# Patient Record
Sex: Male | Born: 1985 | Race: Black or African American | Hispanic: No | Marital: Single | Smoking: Never smoker
Health system: Southern US, Community
[De-identification: ages and names within clinical notes are randomized; demographics above are authoritative.]

## PROBLEM LIST (undated history)

## (undated) DIAGNOSIS — K219 Gastro-esophageal reflux disease without esophagitis: Secondary | ICD-10-CM

## (undated) DIAGNOSIS — I10 Essential (primary) hypertension: Secondary | ICD-10-CM

## (undated) HISTORY — DX: Gastro-esophageal reflux disease without esophagitis: K21.9

## (undated) HISTORY — DX: Essential (primary) hypertension: I10

---
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
## 2008-01-31 ENCOUNTER — Emergency Department (HOSPITAL_COMMUNITY): Admission: EM | Admit: 2008-01-31 | Discharge: 2008-01-31 | Payer: Self-pay | Admitting: Emergency Medicine

## 2008-03-13 ENCOUNTER — Emergency Department (HOSPITAL_COMMUNITY): Admission: EM | Admit: 2008-03-13 | Discharge: 2008-03-13 | Payer: Self-pay | Admitting: Family Medicine

## 2008-04-19 ENCOUNTER — Emergency Department (HOSPITAL_COMMUNITY): Admission: EM | Admit: 2008-04-19 | Discharge: 2008-04-19 | Payer: Self-pay | Admitting: Emergency Medicine

## 2008-04-21 ENCOUNTER — Emergency Department (HOSPITAL_COMMUNITY): Admission: EM | Admit: 2008-04-21 | Discharge: 2008-04-21 | Payer: Self-pay | Admitting: Family Medicine

## 2008-04-27 ENCOUNTER — Emergency Department (HOSPITAL_COMMUNITY): Admission: EM | Admit: 2008-04-27 | Discharge: 2008-04-28 | Payer: Self-pay | Admitting: Emergency Medicine

## 2009-05-07 ENCOUNTER — Emergency Department (HOSPITAL_COMMUNITY): Admission: EM | Admit: 2009-05-07 | Discharge: 2009-05-07 | Payer: Self-pay | Admitting: Family Medicine

## 2009-09-09 ENCOUNTER — Emergency Department (HOSPITAL_COMMUNITY): Admission: EM | Admit: 2009-09-09 | Discharge: 2009-09-09 | Payer: Self-pay | Admitting: Emergency Medicine

## 2009-10-25 ENCOUNTER — Emergency Department (HOSPITAL_COMMUNITY): Admission: EM | Admit: 2009-10-25 | Discharge: 2009-10-25 | Payer: Self-pay | Admitting: Family Medicine

## 2009-10-30 ENCOUNTER — Emergency Department (HOSPITAL_COMMUNITY): Admission: EM | Admit: 2009-10-30 | Discharge: 2009-10-30 | Payer: Self-pay | Admitting: Emergency Medicine

## 2011-03-30 LAB — POCT URINALYSIS DIP (DEVICE)
Glucose, UA: NEGATIVE
Hgb urine dipstick: NEGATIVE
Nitrite: NEGATIVE
Urobilinogen, UA: 1
pH: 5.5

## 2011-03-30 LAB — GC/CHLAMYDIA PROBE AMP, GENITAL
Chlamydia, DNA Probe: NEGATIVE
GC Probe Amp, Genital: NEGATIVE

## 2011-03-30 LAB — CULTURE, ROUTINE-ABSCESS: Gram Stain: NONE SEEN

## 2011-03-31 LAB — POCT I-STAT, CHEM 8
BUN: 11
Chloride: 101
HCT: 44
Potassium: 3.3 — ABNORMAL LOW
Sodium: 137

## 2011-03-31 LAB — RAPID STREP SCREEN (MED CTR MEBANE ONLY): Streptococcus, Group A Screen (Direct): NEGATIVE

## 2011-10-06 ENCOUNTER — Emergency Department (HOSPITAL_COMMUNITY)
Admission: EM | Admit: 2011-10-06 | Discharge: 2011-10-07 | Disposition: A | Discharge status: Home / Self Care | Payer: Self-pay | Attending: Emergency Medicine | Admitting: Emergency Medicine

## 2011-10-06 ENCOUNTER — Emergency Department (HOSPITAL_COMMUNITY): Payer: Self-pay

## 2011-10-06 ENCOUNTER — Encounter (HOSPITAL_COMMUNITY): Payer: Self-pay

## 2011-10-06 DIAGNOSIS — Y9229 Other specified public building as the place of occurrence of the external cause: Secondary | ICD-10-CM | POA: Insufficient documentation

## 2011-10-06 DIAGNOSIS — W19XXXA Unspecified fall, initial encounter: Secondary | ICD-10-CM | POA: Insufficient documentation

## 2011-10-06 DIAGNOSIS — M25579 Pain in unspecified ankle and joints of unspecified foot: Secondary | ICD-10-CM | POA: Insufficient documentation

## 2011-10-06 DIAGNOSIS — S93409A Sprain of unspecified ligament of unspecified ankle, initial encounter: Secondary | ICD-10-CM | POA: Insufficient documentation

## 2011-10-06 NOTE — ED Provider Notes (Signed)
History     CSN: [id number]  Arrival date & time 10/06/11  2144   First MD Initiated Contact with Patient 10/06/11 2318      Chief Complaint  Patient presents with  . Ankle Pain    (Consider location/radiation/quality/duration/timing/severity/associated sxs/prior treatment) Patient is a 26 y.o. male presenting with ankle pain. The history is provided by the patient. No language interpreter was used.  Ankle Pain  The incident occurred 6 to 12 hours ago. The incident occurred at the gym. The injury mechanism was a fall. The pain is present in the right ankle. The quality of the pain is described as aching. The pain is moderate. The pain has been fluctuating since onset. Associated symptoms include inability to bear weight. Pertinent negatives include no loss of motion, no muscle weakness, no loss of sensation and no tingling. He reports no foreign bodies present. The symptoms are aggravated by bearing weight. He has tried rest for the symptoms. The treatment provided no relief.    History reviewed. No pertinent past medical history.  History reviewed. No pertinent past surgical history.  History reviewed. No pertinent family history.  History  Substance Use Topics  . Smoking status: Not on file  . Smokeless tobacco: Not on file  . Alcohol Use: No      Review of Systems  Musculoskeletal: Positive for arthralgias.  Neurological: Negative for tingling.  All other systems reviewed and are negative.    Allergies  Review of patient's allergies indicates no known allergies.  Home Medications  No current outpatient prescriptions on file.  BP 111/77  Pulse 84  Temp(Src) 97.8 F (36.6 C) (Oral)  Resp 20  SpO2 100%  Physical Exam  Nursing note and vitals reviewed. Constitutional: He is oriented to person, place, and time. He appears well-developed and well-nourished. No distress.  HENT:  Head: Normocephalic and atraumatic.  Eyes: Conjunctivae are normal. Pupils are  equal, round, and reactive to light.  Neck: Normal range of motion. Neck supple.  Cardiovascular: Normal rate, regular rhythm, normal heart sounds and intact distal pulses.   Pulmonary/Chest: Effort normal and breath sounds normal.  Abdominal: Soft.  Musculoskeletal: Normal range of motion. He exhibits tenderness.  Neurological: He is alert and oriented to person, place, and time.  Skin: Skin is warm and dry.  Psychiatric: He has a normal mood and affect. His behavior is normal. Judgment and thought content normal.    ED Course  Procedures (including critical care time)  Labs Reviewed - No data to display No results found.   No diagnosis found.  Right Ankle sprain.  MDM          Jimmye Norman, NP 10/07/11 [phone]

## 2011-10-06 NOTE — ED Notes (Signed)
Pt was playing basketball this evening and twisted his right ankle, he said he heard a pop

## 2011-10-07 MED ORDER — HYDROCODONE-ACETAMINOPHEN 5-500 MG PO TABS: 1.0000 | ORAL_TABLET | Freq: Four times a day (QID) | ORAL | Status: AC | PRN

## 2011-10-07 NOTE — Discharge Instructions (Signed)
Crutch Use You have been prescribed crutches to take weight off one of your lower legs or feet (extremities). When using crutches, make sure you are not putting pressure on the armpit (axilla). This could cause damage to the nerves that extend from your axilla to the hand and arm. When fitted properly the crutches should be 2 to 3 finger widths below the axilla. Your weight should be supported by your hand, and not by resting upon the crutch with the axilla. When walking, first step with the crutches, then swing the healthy leg through and slightly ahead. When going up stairs, first step up with the healthy leg and then follow with the crutches and injured leg up to the same step, and so forth. If there is a handrail, hold both crutches in one hand, place your other hand on the handrail, and while placing your weight on your arms, lift your good leg to the step, then bring the crutches and the injured leg up to that step. Repeat for each step. When going down stairs, first step with the injured leg and crutches, following down with the healthy leg to the same step. Be very careful, as going down stairs with crutches is very challenging. If you feel wobbly or nervous, sit down and inch yourself down the stairs on your butt. To get up from a chair, hold injured leg forward, grab armrest with one hand and the top of the crutches with the other hand. Using these supports, pull yourself up to a standing position. Reverse this procedure for sitting. See your caregiver for follow up as suggested. If you are discharged in an ace wrap and develop numbness, tingling, swelling, or increased pain, loosen the ace wrap and re-wrap looser. If these problems persist, see your caregiver as needed. If you have been instructed to use partial weight bearing, bear (apply) the amount of weight as suggested by your caregiver. Do not bear weight in an amount that causes pain on the area of injury. Document Released: 06/12/2000  Document Revised: 06/04/2011 Document Reviewed: 08/20/2008 [facility] Patient Information 2012 [place], [place].  Ankle Sprain An ankle sprain is an injury to the strong, fibrous tissues (ligaments) that hold the bones of your ankle joint together.  CAUSES Ankle sprain usually is caused by a fall or by twisting your ankle. People who participate in sports are more prone to these types of injuries.  SYMPTOMS  Symptoms of ankle sprain include:  Pain in your ankle. The pain may be present at rest or only when you are trying to stand or walk.   Swelling.   Bruising. Bruising may develop immediately or within 1 to 2 days after your injury.   Difficulty standing or walking.  DIAGNOSIS  Your caregiver will ask you details about your injury and perform a physical exam of your ankle to determine if you have an ankle sprain. During the physical exam, your caregiver will press and squeeze specific areas of your foot and ankle. Your caregiver will try to move your ankle in certain ways. An X-ray exam may be done to be sure a bone was not broken or a ligament did not separate from one of the bones in your ankle (avulsion).  TREATMENT  Certain types of braces can help stabilize your ankle. Your caregiver can make a recommendation for this. Your caregiver may recommend the use of medication for pain. If your sprain is severe, your caregiver may refer you to a surgeon who helps to restore function to  parts of your skeletal system (orthopedist) or a physical therapist. HOME CARE INSTRUCTIONS  Apply ice to your injury for 1 to 2 days or as directed by your caregiver. Applying ice helps to reduce inflammation and pain.  Put ice in a plastic bag.   Place a towel between your skin and the bag.   Leave the ice on for 15 to 20 minutes at a time, every 2 hours while you are awake.   Take over-the-counter or prescription medicines for pain, discomfort, or fever only as directed by your caregiver.   Keep your  injured leg elevated, when possible, to lessen swelling.   If your caregiver recommends crutches, use them as instructed. Gradually, put weight on the affected ankle. Continue to use crutches or a cane until you can walk without feeling pain in your ankle.   If you have a plaster splint, wear the splint as directed by your caregiver. Do not rest it on anything harder than a pillow the first 24 hours. Do not put weight on it. Do not get it wet. You may take it off to take a shower or bath.   You may have been given an elastic bandage to wear around your ankle to provide support. If the elastic bandage is too tight (you have numbness or tingling in your foot or your foot becomes cold and blue), adjust the bandage to make it comfortable.   If you have an air splint, you may blow more air into it or let air out to make it more comfortable. You may take your splint off at night and before taking a shower or bath.   Wiggle your toes in the splint several times per day if you are able.  SEEK MEDICAL CARE IF:   You have an increase in bruising, swelling, or pain.   Your toes feel cold.   Pain relief is not achieved with medication.  SEEK IMMEDIATE MEDICAL CARE IF: Your toes are numb or blue or you have severe pain. MAKE SURE YOU:   Understand these instructions.   Will watch your condition.   Will get help right away if you are not doing well or get worse.  Document Released: 06/15/2005 Document Revised: 06/04/2011 Document Reviewed: 01/18/2008 [facility] Patient Information 2012 [place], [place].

## 2011-10-08 NOTE — ED Provider Notes (Signed)
Medical screening examination/treatment/procedure(s) were performed by non-physician practitioner and as supervising physician I was immediately available for consultation/collaboration.   Vida Roller, MD 10/08/11 [phone]

## 2012-06-16 ENCOUNTER — Encounter (HOSPITAL_COMMUNITY): Payer: Self-pay

## 2012-06-16 ENCOUNTER — Emergency Department (INDEPENDENT_AMBULATORY_CARE_PROVIDER_SITE_OTHER)
Admission: EM | Admit: 2012-06-16 | Discharge: 2012-06-16 | Disposition: A | Discharge status: Home / Self Care | Payer: Self-pay | Source: Home / Self Care

## 2012-06-16 DIAGNOSIS — L739 Follicular disorder, unspecified: Secondary | ICD-10-CM

## 2012-06-16 DIAGNOSIS — R030 Elevated blood-pressure reading, without diagnosis of hypertension: Secondary | ICD-10-CM

## 2012-06-16 DIAGNOSIS — L738 Other specified follicular disorders: Secondary | ICD-10-CM

## 2012-06-16 DIAGNOSIS — L678 Other hair color and hair shaft abnormalities: Secondary | ICD-10-CM

## 2012-06-16 DIAGNOSIS — E669 Obesity, unspecified: Secondary | ICD-10-CM

## 2012-06-16 LAB — COMPREHENSIVE METABOLIC PANEL
ALT: 16 U/L (ref 0–53)
AST: 19 U/L (ref 0–37)
Alkaline Phosphatase: 65 U/L (ref 39–117)
CO2: 25 mEq/L (ref 19–32)
Chloride: 105 mEq/L (ref 96–112)
GFR calc Af Amer: >90 mL/min (ref >90)
GFR calc non Af Amer: >90 mL/min (ref >90)
Glucose, Bld: 115 mg/dL — ABNORMAL HIGH (ref 70–99)
Potassium: 3.7 mEq/L (ref 3.5–5.1)
Sodium: 142 mEq/L (ref 135–145)

## 2012-06-16 MED ORDER — SULFAMETHOXAZOLE-TMP DS 800-160 MG PO TABS: 1.0000 | ORAL_TABLET | Freq: Two times a day (BID) | ORAL | Status: DC

## 2012-06-16 MED ORDER — CHLORHEXIDINE GLUCONATE 4 % EX LIQD: 60.0000 mL | Freq: Every day | CUTANEOUS | Status: DC | PRN

## 2012-06-16 NOTE — ED Notes (Signed)
Patient states broke out with a rash on his chest and stomach area about 5 days ago.  OTC medications are not helping

## 2012-06-16 NOTE — ED Provider Notes (Signed)
History     CSN: [id number]  Arrival date & time 06/16/12  1251   None     Chief Complaint  Patient presents with  . Rash    (Consider location/radiation/quality/duration/timing/severity/associated sxs/prior treatment) The history is provided by the patient.    Pt says that he has been having a rash on the abdomen after a ruptured boil was noted on the abdomen.  He is having multiple areas of inflamed bumps on the abdomen.     History reviewed. No pertinent past medical history.  History reviewed. No pertinent past surgical history.  No family history on file.  History  Substance Use Topics  . Smoking status: Not on file  . Smokeless tobacco: Not on file  . Alcohol Use: No      Review of Systems  Constitutional: Negative.   HENT: Negative.   Eyes: Negative.   Respiratory: Negative.   Cardiovascular: Negative.   Musculoskeletal: Negative.   Neurological: Negative.   Psychiatric/Behavioral: Negative.     Allergies  Review of patient's allergies indicates no known allergies.  Home Medications  No current outpatient prescriptions on file.  BP 141/74  Pulse 77  Temp 98.1 F (36.7 C) (Oral)  Resp 18  SpO2 99%  Physical Exam  Constitutional: He is oriented to person, place, and time. He appears well-developed and well-nourished. No distress.  HENT:  Head: Normocephalic and atraumatic.  Eyes: EOM are normal. Pupils are equal, round, and reactive to light.  Pulmonary/Chest: Effort normal.  Abdominal: Soft. Bowel sounds are normal.  Musculoskeletal: Normal range of motion.  Neurological: He is alert and oriented to person, place, and time.  Skin: Skin is warm and dry.       Several areas of folliculitis noted on abdomen, swelling, redness, purulent areas around several hair follicles   Psychiatric: He has a normal mood and affect. His behavior is normal. Judgment and thought content normal.    ED Course  Procedures (including critical care  time)  Labs Reviewed - No data to display No results found. No diagnosis found.   MDM  IMPRESSION  Folliculitis  Elevated BP  Obesity  RECOMMENDATIONS / PLAN  Check some labs today Wash body down with Hibiclens Solution Soap Bactrim DS po BID x 1 week Check CMP and a1c today  FOLLOW UP 2 months for CPE and to recheck blood pressure  The patient was given clear instructions to go to ER or return to medical center if symptoms don't improve, worsen or new problems develop.  The patient verbalized understanding.  The patient was told to call to get lab results if they haven't heard anything in the next week.           Cleora Fleet, MD 06/16/12 1401

## 2012-06-20 ENCOUNTER — Telehealth (HOSPITAL_COMMUNITY): Payer: Self-pay

## 2012-06-20 NOTE — Telephone Encounter (Signed)
Message copied by Lestine Mount on Mon Jun 20, 2012  5:18 PM ------      Message from: Cleora Fleet      Created: Fri Jun 17, 2012  9:34 PM       Please notify patient that labs consistent with prediabetes.  Recommend low carb diet and exercise 3-5 times per week.  No concentrated sweets.  Recheck in 4 months.             Rodney Langton, MD, CDE, FAAFP      [facility]      [facility]      [place], [place]

## 2012-09-29 ENCOUNTER — Emergency Department (INDEPENDENT_AMBULATORY_CARE_PROVIDER_SITE_OTHER)
Admission: EM | Admit: 2012-09-29 | Discharge: 2012-09-29 | Disposition: A | Discharge status: Home / Self Care | Payer: Self-pay | Source: Home / Self Care | Attending: Emergency Medicine | Admitting: Emergency Medicine

## 2012-09-29 ENCOUNTER — Encounter (HOSPITAL_COMMUNITY): Payer: Self-pay | Admitting: Emergency Medicine

## 2012-09-29 DIAGNOSIS — M549 Dorsalgia, unspecified: Secondary | ICD-10-CM

## 2012-09-29 DIAGNOSIS — L0293 Carbuncle, unspecified: Secondary | ICD-10-CM

## 2012-09-29 DIAGNOSIS — L0292 Furuncle, unspecified: Secondary | ICD-10-CM

## 2012-09-29 MED ORDER — MELOXICAM 7.5 MG PO TABS: 7.5000 mg | ORAL_TABLET | Freq: Every day | ORAL | Status: DC

## 2012-09-29 MED ORDER — DOXYCYCLINE HYCLATE 100 MG PO CAPS: 100.0000 mg | ORAL_CAPSULE | Freq: Two times a day (BID) | ORAL | Status: AC

## 2012-09-29 NOTE — ED Provider Notes (Signed)
History     CSN: [id number]  Arrival date & time 09/29/12  1014   First MD Initiated Contact with Patient 09/29/12 1021      Chief Complaint  Patient presents with  . Back Pain    (Consider location/radiation/quality/duration/timing/severity/associated sxs/prior treatment) HPI Comments: Patient presents urgent care this morning complaining of lower back pain started about 3 days ago he denies any falls or injuries but does however describe that he was moving furniture about 4 days ago. Pain is located in his lower back sharp in character it is exacerbated with movements and changes in position. Patient denies any associated lower term any weakness or numbness or tingling sensation to his lower extremities. Patient also denies any urinary or bowel movement incontinence. Patient denies any constitutional symptoms such as fevers, unintentional weight loss or generalized malaise.  Patient also brings a second concern as he is been on and off for several months experiencing recurrent skin infections in the form of little tender bumps that are red and sometimes come to a whitehead.  Patient is a 27 y.o. male presenting with back pain. The history is provided by the patient.  Back Pain Location:  Lumbar spine Quality:  Aching Radiates to:  Does not radiate Pain severity:  Moderate Pain is:  Same all the time Onset quality:  Gradual Progression:  Waxing and waning Chronicity:  Recurrent Context: recent injury   Context: not occupational injury and not twisting   Relieved by:  Nothing Worsened by:  Movement Ineffective treatments:  None tried Associated symptoms: no fever     History reviewed. No pertinent past medical history.  History reviewed. No pertinent past surgical history.  No family history on file.  History  Substance Use Topics  . Smoking status: Not on file  . Smokeless tobacco: Not on file  . Alcohol Use: Yes      Review of Systems  Constitutional: Negative  for fever, chills, diaphoresis, activity change, appetite change and fatigue.  Musculoskeletal: Positive for back pain. Negative for myalgias, joint swelling and gait problem.  Skin: Positive for rash. Negative for color change, pallor and wound.    Allergies  Review of patient's allergies indicates no known allergies.  Home Medications   Current Outpatient Rx  Name  Route  Sig  Dispense  Refill  . chlorhexidine (HIBICLENS) 4 % external liquid   Topical   Apply 60 mLs (4 application total) topically daily as needed.   120 mL   0   . doxycycline (VIBRAMYCIN) 100 MG capsule   Oral   Take 1 capsule (100 mg total) by mouth 2 (two) times daily.   20 capsule   0   . meloxicam (MOBIC) 7.5 MG tablet   Oral   Take 1 tablet (7.5 mg total) by mouth daily. Take one tablet daily for 2 weeks   14 tablet   0   . sulfamethoxazole-trimethoprim (BACTRIM DS) 800-160 MG per tablet   Oral   Take 1 tablet by mouth 2 (two) times daily.   14 tablet   0     BP 147/89  Pulse 73  Temp(Src) 98.3 F (36.8 C) (Oral)  Resp 18  SpO2 96%  Physical Exam  Nursing note and vitals reviewed. Constitutional: Vital signs are normal. He appears well-developed and well-nourished.  Eyes: Conjunctivae are normal.  Musculoskeletal:       Lumbar back: He exhibits tenderness. He exhibits normal range of motion, no bony tenderness, no swelling, no edema, no deformity,  no laceration, no spasm and normal pulse.       Back:  Neurological: He is alert.  Skin: Rash noted. Rash is pustular. There is erythema. No pallor.       ED Course  Procedures (including critical care time)  Labs Reviewed  CULTURE, ROUTINE-ABSCESS   No results found.   1. Back pain   2. 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 Patient has been encouraged to take meloxicam to 2 weeks and we discuss long-term strategies as weight reduction. Have instructed  patient to return if any red flag symptoms verbal and written educational information was provided to patient.  Problem #2 recurrent for carbuncles and furunculosis- most likely secondary to his body habitus. Patient has been provided with a antibiotic cycle with doxycycline 1 of this pustular lesions was cultured today.        Jimmie Molly, MD 09/29/12 1300

## 2012-09-29 NOTE — ED Notes (Signed)
Multiple complains: back pain for 3 days, no specific injury remembered , but he did move furniture several days ago.  Pain is located in lower back.   Patient has "bumps" all over body.  First noticed a month ago.  Patient does have various stages of bumps breaking out on him, all over-some red with white top, some scabbed over, some scars.

## 2012-10-03 ENCOUNTER — Telehealth (HOSPITAL_COMMUNITY): Payer: Self-pay | Admitting: *Deleted

## 2012-10-03 LAB — CULTURE, ROUTINE-ABSCESS

## 2012-10-03 NOTE — ED Notes (Signed)
Abscess culture L finger: Few MRSA. Pt. adequately treated with Doxycycline.  I called pt.  Pt. verified x 2 and given results.  Pt. told he was adequately treated and given the [facility] MRSA instructions. Pt. voiced understanding. Pt.'s questions answered. Vassie Moselle 10/03/2012

## 2015-11-02 ENCOUNTER — Encounter (HOSPITAL_COMMUNITY): Payer: Self-pay | Admitting: Emergency Medicine

## 2015-11-02 ENCOUNTER — Emergency Department (HOSPITAL_COMMUNITY)
Admission: EM | Admit: 2015-11-02 | Discharge: 2015-11-02 | Disposition: A | Discharge status: Home / Self Care | Payer: Self-pay | Attending: Emergency Medicine | Admitting: Emergency Medicine

## 2015-11-02 DIAGNOSIS — K047 Periapical abscess without sinus: Secondary | ICD-10-CM

## 2015-11-02 DIAGNOSIS — K0889 Other specified disorders of teeth and supporting structures: Secondary | ICD-10-CM

## 2015-11-02 DIAGNOSIS — I10 Essential (primary) hypertension: Secondary | ICD-10-CM

## 2015-11-02 DIAGNOSIS — Z79899 Other long term (current) drug therapy: Secondary | ICD-10-CM | POA: Insufficient documentation

## 2015-11-02 MED ORDER — IBUPROFEN 800 MG PO TABS: 800.0000 mg | ORAL_TABLET | Freq: Three times a day (TID) | ORAL | Status: DC | PRN

## 2015-11-02 MED ORDER — PENICILLIN V POTASSIUM 500 MG PO TABS: 1000.0000 mg | ORAL_TABLET | Freq: Two times a day (BID) | ORAL | Status: DC

## 2015-11-02 NOTE — ED Provider Notes (Signed)
CSN: [id number]     Arrival date & time 11/02/15  1312 History   First MD Initiated Contact with Patient 11/02/15 1403     Chief Complaint  Patient presents with  . Dental Pain     (Consider location/radiation/quality/duration/timing/severity/associated sxs/prior Treatment) HPI Comments: Logan Patton is a 30 y.o. male who presents to the ED with complaints of right lower molar pain 2 weeks. Patient states he thinks that his wisdom tooth is coming in or my be infected. He describes the pain is 10/10 constant throbbing in the right lower molar, radiating to the right ear, worse with air flow, and mildly improved with Advil and Aleve. Associated symptoms include right submandibular swelling. He denies any gum swelling or drainage, ear drainage, rhinorrhea, sore throat, trismus, drooling, neck swelling, fevers, chills, chest pain, shortness breath, abdominal pain, nausea vomiting, diarrhea, constipation, dysuria, hematuria, numbness, tingling, or focal weakness. He is a nonsmoker. He does not have a dentist and has not seen one in approximately 1 year. No immunosuppressant meds or conditions.  Patient is a 30 y.o. male presenting with tooth pain. The history is provided by the patient. No language interpreter was used.  Dental Pain Location:  Lower Lower teeth location:  32/RL 3rd molar Quality:  Throbbing Severity:  Severe Onset quality:  Gradual Duration:  2 weeks Timing:  Constant Progression:  Unchanged Chronicity:  New Context comment:  Wisdom tooth erupting Relieved by:  NSAIDs Exacerbated by: airflow. Ineffective treatments:  None tried Associated symptoms: no difficulty swallowing, no drooling, no facial swelling, no fever, no gum swelling, no neck swelling, no oral bleeding and no trismus   Risk factors: lack of dental care   Risk factors: no immunosuppression and no smoking     History reviewed. No pertinent past medical history. History reviewed. No pertinent past surgical  history. No family history on file. Social History  Substance Use Topics  . Smoking status: Never Smoker   . Smokeless tobacco: None  . Alcohol Use: Yes    Review of Systems  Constitutional: Negative for fever and chills.  HENT: Positive for dental problem and ear pain (from R lower molar). Negative for drooling, ear discharge, facial swelling, rhinorrhea, sore throat and trouble swallowing.   Respiratory: Negative for shortness of breath.   Cardiovascular: Negative for chest pain.  Gastrointestinal: Negative for nausea, vomiting, abdominal pain, diarrhea and constipation.  Genitourinary: Negative for dysuria and hematuria.  Musculoskeletal: Negative for myalgias and arthralgias.  Skin: Negative for color change.  Allergic/Immunologic: Negative for immunocompromised state.  Neurological: Negative for weakness and numbness.  Hematological: Positive for adenopathy (R submandibular).  Psychiatric/Behavioral: Negative for confusion.   10 Systems reviewed and are negative for acute change except as noted in the HPI.    Allergies  Review of patient's allergies indicates no known allergies.  Home Medications   Prior to Admission medications   Medication Sig Start Date End Date Taking? Authorizing Provider  chlorhexidine (HIBICLENS) 4 % external liquid Apply 60 mLs (4 application total) topically daily as needed. 06/16/12   Clanford Cyndie MullL Johnson, MD  meloxicam (MOBIC) 7.5 MG tablet Take 1 tablet (7.5 mg total) by mouth daily. Take one tablet daily for 2 weeks 09/29/12   Jimmie MollyPaolo Coll, MD  sulfamethoxazole-trimethoprim (BACTRIM DS) 800-160 MG per tablet Take 1 tablet by mouth 2 (two) times daily. 06/16/12   Clanford L Johnson, MD   BP 164/107 mmHg  Pulse 82  Temp(Src) 97.8 F (36.6 C) (Oral)  Resp 18  Ht  6\' 4"  (1.93 m)  Wt 181.439 kg  BMI 48.71 kg/m2  SpO2 100% Physical Exam  Constitutional: He is oriented to person, place, and time. Vital signs are normal. He appears well-developed and  well-nourished.  Non-toxic appearance. No distress.  Afebrile, nontoxic, NAD  HENT:  Head: Normocephalic and atraumatic.  Right Ear: Hearing, tympanic membrane, external ear and ear canal normal.  Left Ear: Hearing, tympanic membrane, external ear and ear canal normal.  Nose: Nose normal.  Mouth/Throat: Uvula is midline, oropharynx is clear and moist and mucous membranes are normal. No trismus in the jaw. Abnormal dentition (?erupting molar #32). No dental abscesses or uvula swelling.    Ears are clear bilaterally. Nose clear. Oropharynx clear and moist, without uvular swelling or deviation, no trismus or drooling, no tonsillar swelling or erythema, no exudates.  R lower molar #32 appears to be erupting, unable to visualize if it's stuck on the other molar or not but slight amount of gingival erythema and swelling surrounding this area, no focal abscess identified. No evidence of ludwig's or PTA.   Eyes: Conjunctivae and EOM are normal. Right eye exhibits no discharge. Left eye exhibits no discharge.  Neck: Normal range of motion. Neck supple.  Cardiovascular: Normal rate.   Pulmonary/Chest: Effort normal. No respiratory distress.  Abdominal: Normal appearance. He exhibits no distension.  Musculoskeletal: Normal range of motion.  Lymphadenopathy:       Head (right side): Submandibular adenopathy present.  Small R sided submandibular lymph node enlarged and tender, likely reactive  Neurological: He is alert and oriented to person, place, and time. He has normal strength. No sensory deficit.  Skin: Skin is warm, dry and intact. No rash noted.  Psychiatric: He has a normal mood and affect.  Nursing note and vitals reviewed.   ED Course  Procedures (including critical care time) Labs Review Labs Reviewed - No data to display  Imaging Review No results found. I have personally reviewed and evaluated these images and lab results as part of my medical decision-making.   EKG  Interpretation None      MDM   Final diagnoses:  Pain, dental  Dental infection  Essential hypertension    30 y.o. male here with Dental pain associated with possible dental infection and possible erupting wisdom tooth, with patient afebrile, non toxic appearing and swallowing secretions well. I gave patient referral to dentist and stressed the importance of dental follow up for ultimate management of dental pain.  I have also discussed reasons to return immediately to the ER.  Patient expresses understanding and agrees with plan.  I will also give PCN VK and ibuprofen for pain control.    BP 164/107 mmHg  Pulse 82  Temp(Src) 97.8 F (36.6 C) (Oral)  Resp 18  Ht 6\' 4"  (1.93 m)  Wt 181.439 kg  BMI 48.71 kg/m2  SpO2 100%  Meds ordered this encounter  Medications  . penicillin v potassium (VEETID) 500 MG tablet    Sig: Take 2 tablets (1,000 mg total) by mouth 2 (two) times daily. X 7 days    Dispense:  28 tablet    Refill:  0    Order Specific Question:  Supervising Provider    Answer:  MILLER, BRIAN [[id number]]  . ibuprofen (ADVIL,MOTRIN) 800 MG tablet    Sig: Take 1 tablet (800 mg total) by mouth every 8 (eight) hours as needed for fever, mild pain or moderate pain.    Dispense:  30 tablet    Refill:  1    Order Specific Question:  Supervising Provider    Answer:  Eber Hong [address] [place], PA-C 11/02/15 1423  Derwood Kaplan, MD 11/02/15 1712

## 2015-11-02 NOTE — Discharge Instructions (Signed)
Apply warm compresses to jaw throughout the day. Take antibiotic until finished. Use tylenol or motrin as needed for pain. Followup with a dentist is very important for ongoing evaluation and management of recurrent dental pain, use the list below to find a dentist or call the dentist listed above in the next 24-48 hours for ongoing dentistry follow up.  Return to emergency department for emergent changing or worsening symptoms.    Dental Pain Dental pain may be caused by many things, including:  Tooth decay (cavities or caries). Cavities cause the nerve of your tooth to be open to air and hot or cold temperatures. This can cause pain or discomfort.  Abscess or infection. A dental abscess is an area that is full of infected pus from a bacterial infection in the inner part of the tooth (pulp). It usually happens at the end of the tooth's root.  Injury.  An unknown reason (idiopathic). Your pain may be mild or severe. It may only happen when:  You are chewing.  You are exposed to hot or cold temperature.  You are eating or drinking sugary foods or beverages, such as:  Soda.  Candy. Your pain may also be there all of the time. HOME CARE Watch your dental pain for any changes. Do these things to lessen your discomfort:  Take medicines only as told by your dentist.  If your dentist tells you to take an antibiotic medicine, finish all of it even if you start to feel better.  Keep all follow-up visits as told by your dentist. This is important.  Do not apply heat to the outside of your face.  Rinse your mouth or gargle with salt water if told by your dentist. This helps with pain and swelling.  You can make salt water by adding  tsp of salt to 1 cup of warm water.  Apply ice to the painful area of your face:  Put ice in a plastic bag.  Place a towel between your skin and the bag.  Leave the ice on for 20 minutes, 2-3 times per day.  Avoid foods or drinks that cause you  pain, such as:  Very hot or very cold foods or drinks.  Sweet or sugary foods or drinks. GET HELP IF:  Your pain is not helped with medicines.  Your symptoms are worse.  You have new symptoms. GET HELP RIGHT AWAY IF:  You cannot open your mouth.  You are having trouble breathing or swallowing.  You have a fever.  Your face, neck, or jaw is puffy (swollen).   This information is not intended to replace advice given to you by your health care provider. Make sure you discuss any questions you have with your health care provider.   Document Released: 12/02/2007 Document Revised: 10/30/2014 Document Reviewed: 06/11/2014 Elsevier Interactive Patient Education 2016 Elsevier Inc.  Dental Abscess A dental abscess is pus in or around a tooth. HOME CARE  Take medicines only as told by your dentist.  If you were prescribed antibiotic medicine, finish all of it even if you start to feel better.  Rinse your mouth (gargle) often with salt water.  Do not drive or use heavy machinery, like a lawn mower, while taking pain medicine.  Do not apply heat to the outside of your mouth.  Keep all follow-up visits as told by your dentist. This is important. GET HELP IF:  Your pain is worse, and medicine does not help. GET HELP RIGHT AWAY IF:  You  have a fever or chills.  Your symptoms suddenly get worse.  You have a very bad headache.  You have problems breathing or swallowing.  You have trouble opening your mouth.  You have puffiness (swelling) in your neck or around your eye.   This information is not intended to replace advice given to you by your health care provider. Make sure you discuss any questions you have with your health care provider.   Document Released: 10/30/2014 Document Reviewed: 10/30/2014 Elsevier Interactive Patient Education [employer]  Emergency Department Resource Guide 1) Find a Doctor and Pay Out of Pocket Although you won't have to find out  who is covered by your insurance plan, it is a good idea to ask around and get recommendations. You will then need to call the office and see if the doctor you have chosen will accept you as a new patient and what types of options they offer for patients who are self-pay. Some doctors offer discounts or will set up payment plans for their patients who do not have insurance, but you will need to ask so you aren't surprised when you get to your appointment.  2) Contact Your Local Health Department Not all health departments have doctors that can see patients for sick visits, but many do, so it is worth a call to see if yours does. If you don't know where your local health department is, you can check in your phone book. The CDC also has a tool to help you locate your state's health department, and many state websites also have listings of all of their local health departments.  3) Find a Walk-in Clinic If your illness is not likely to be very severe or complicated, you may want to try a walk in clinic. These are popping up all over the country in pharmacies, drugstores, and shopping centers. They're usually staffed by nurse practitioners or physician assistants that have been trained to treat common illnesses and complaints. They're usually fairly quick and inexpensive. However, if you have serious medical issues or chronic medical problems, these are probably not your best option.  No Primary Care Doctor: - Call [facility] Connect at  [phone] - they can help you locate a primary care doctor that  accepts your insurance, provides certain services, etc. - Physician Referral Service- [phone]  Chronic Pain Problems: Organization         Address  Phone   Notes  [facility]  [phone] Patients need to be referred by their primary care doctor.   Medication Assistance: Organization         Address  Phone   Notes  [facility] Medication [facility] [address]  [place] [place], [place] [address] [phone] --Must be a resident of [facility] -- Must have NO insurance coverage whatsoever (no Medicaid/ Medicare, etc.) -- The pt. MUST have a primary care doctor that directs their care regularly and follows them in the community   [facility]  [phone]   [place]  [phone]     Dental Care: Organization         Address  Phone  Notes  [facility] Department of [facility] [address] [place], [place] [phone] Accepts children up to age 16 who are enrolled in [place] or [place] Health Choice; pregnant women with a Medicaid card; and children who have applied for Medicaid or [place] Health Choice, but were declined, whose parents can pay  a reduced fee at time of service.  [facility] Department of [facility]  [address], [place] [phone] Accepts children up to age 68 who are enrolled in [place] or [place] Health Choice; pregnant women with a Medicaid card; and children who have applied for Medicaid or [place] Health Choice, but were declined, whose parents can pay a reduced fee at time of service.  [facility]  [address] [place], [place] [phone] Patients are seen by appointment only. Walk-ins are not accepted. [facility] will see patients 57 years of age and older. Monday - Tuesday (8am-5pm) Most Wednesdays (8:30-5pm) $30 per visit, cash only  [facility]  [address], [facility] [phone] Patients are seen by appointment only. Walk-ins are not accepted. [facility] will see patients 15 years of age and older. One Wednesday Evening (Monthly: Volunteer Based).  $30 per visit, cash only  [employer] [facility] [employer]  [phone] for adults; Children under age 31, call Graduate Pediatric Dentistry at [phone]. Children aged 36-14, please call [phone] to request a pediatric  application.  Dental services are provided in all areas of dental care including fillings, crowns and bridges, complete and partial dentures, implants, gum treatment, root canals, and extractions. Preventive care is also provided. Treatment is provided to both adults and children. Patients are selected via a lottery and there is often a waiting list.   [facility] [address], [place]  [phone] [email or web address]   [facility] [address] [place], [place] [phone], Ext. [phone] Second and Fourth Thursday of each month, opens at 6:30 AM; Clinic ends at 9 AM.  Patients are seen on a first-come first-served basis, and a limited number are seen during each clinic.   [facility]  [address] [place] [address] [place], [place] [phone]   Eligibility Requirements You must have lived in [place], [place], or [place] counties for at least the last three months.   You cannot be eligible for state or federal sponsored [place], including [employer], [place], or [employer].   You generally cannot be eligible for healthcare insurance through your employer.    How to apply: Eligibility screenings are held every Tuesday and Wednesday afternoon from 1:00 pm until 4:00 pm. You do not need an appointment for the interview!  [facility] [address], [place], [place] [phone]   [facility] Department  [phone]   [facility]  [phone]   [facility]  [phone]

## 2015-11-02 NOTE — ED Notes (Signed)
Per pt, states dental pain for 2 weeks

## 2016-10-11 ENCOUNTER — Encounter (HOSPITAL_COMMUNITY): Payer: Self-pay | Admitting: *Deleted

## 2016-10-11 ENCOUNTER — Ambulatory Visit (HOSPITAL_COMMUNITY)
Admission: EM | Admit: 2016-10-11 | Discharge: 2016-10-11 | Disposition: A | Discharge status: Home / Self Care | Payer: Self-pay | Attending: Family Medicine | Admitting: Family Medicine

## 2016-10-11 DIAGNOSIS — M791 Myalgia, unspecified site: Secondary | ICD-10-CM

## 2016-10-11 DIAGNOSIS — R0789 Other chest pain: Secondary | ICD-10-CM

## 2016-10-11 MED ORDER — DICLOFENAC SODIUM 75 MG PO TBEC: 75.0000 mg | DELAYED_RELEASE_TABLET | Freq: Two times a day (BID) | ORAL | 0 refills | Status: DC

## 2016-10-11 MED ORDER — CYCLOBENZAPRINE HCL 10 MG PO TABS: 10.0000 mg | ORAL_TABLET | Freq: Two times a day (BID) | ORAL | 0 refills | Status: DC | PRN
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
## 2016-10-28 ENCOUNTER — Emergency Department (HOSPITAL_COMMUNITY)
Admission: EM | Admit: 2016-10-28 | Discharge: 2016-10-29 | Disposition: A | Discharge status: Home / Self Care | Payer: Self-pay | Attending: Emergency Medicine | Admitting: Emergency Medicine

## 2016-10-28 ENCOUNTER — Emergency Department (HOSPITAL_COMMUNITY): Payer: Self-pay

## 2016-10-28 ENCOUNTER — Encounter (HOSPITAL_COMMUNITY): Payer: Self-pay | Admitting: Emergency Medicine

## 2016-10-28 DIAGNOSIS — S71102A Unspecified open wound, left thigh, initial encounter: Secondary | ICD-10-CM | POA: Insufficient documentation

## 2016-10-28 DIAGNOSIS — Y999 Unspecified external cause status: Secondary | ICD-10-CM | POA: Insufficient documentation

## 2016-10-28 DIAGNOSIS — W3400XA Accidental discharge from unspecified firearms or gun, initial encounter: Secondary | ICD-10-CM

## 2016-10-28 DIAGNOSIS — S71101A Unspecified open wound, right thigh, initial encounter: Secondary | ICD-10-CM | POA: Insufficient documentation

## 2016-10-28 DIAGNOSIS — S71132A Puncture wound without foreign body, left thigh, initial encounter: Secondary | ICD-10-CM

## 2016-10-28 DIAGNOSIS — Z23 Encounter for immunization: Secondary | ICD-10-CM | POA: Insufficient documentation

## 2016-10-28 DIAGNOSIS — S71131A Puncture wound without foreign body, right thigh, initial encounter: Secondary | ICD-10-CM

## 2016-10-28 DIAGNOSIS — Y939 Activity, unspecified: Secondary | ICD-10-CM | POA: Insufficient documentation

## 2016-10-28 DIAGNOSIS — Z5181 Encounter for therapeutic drug level monitoring: Secondary | ICD-10-CM | POA: Insufficient documentation

## 2016-10-28 DIAGNOSIS — W320XXA Accidental handgun discharge, initial encounter: Secondary | ICD-10-CM | POA: Insufficient documentation

## 2016-10-28 DIAGNOSIS — Y929 Unspecified place or not applicable: Secondary | ICD-10-CM | POA: Insufficient documentation

## 2016-10-28 LAB — CBC WITH DIFFERENTIAL/PLATELET
BASOS ABS: 0 10*3/uL (ref 0.0–0.1)
BASOS PCT: 0 %
EOS ABS: 0.3 10*3/uL (ref 0.0–0.7)
Eosinophils Relative: 2 %
HCT: 42.1 % (ref 39.0–52.0)
HEMOGLOBIN: 14.2 g/dL (ref 13.0–17.0)
LYMPHS PCT: 54 %
Lymphs Abs: 8.6 10*3/uL — ABNORMAL HIGH (ref 0.7–4.0)
MCH: 31.2 pg (ref 26.0–34.0)
MCHC: 33.7 g/dL (ref 30.0–36.0)
MCV: 92.5 fL (ref 78.0–100.0)
Monocytes Absolute: 1.3 10*3/uL — ABNORMAL HIGH (ref 0.1–1.0)
Monocytes Relative: 8 %
NEUTROS PCT: 36 %
Neutro Abs: 5.8 10*3/uL (ref 1.7–7.7)
PLATELETS: 345 10*3/uL (ref 150–400)
RBC: 4.55 MIL/uL (ref 4.22–5.81)
RDW: 14.5 % (ref 11.5–15.5)
WBC: 16 10*3/uL — AB (ref 4.0–10.5)

## 2016-10-28 LAB — BASIC METABOLIC PANEL
ANION GAP: 12 (ref 5–15)
BUN: 14 mg/dL (ref 6–20)
CALCIUM: 8.7 mg/dL — AB (ref 8.9–10.3)
CO2: 21 mmol/L — AB (ref 22–32)
Chloride: 108 mmol/L (ref 101–111)
Creatinine, Ser: 0.98 mg/dL (ref 0.61–1.24)
GFR calc non Af Amer: >60 mL/min (ref >60)
Glucose, Bld: 129 mg/dL — ABNORMAL HIGH (ref 65–99)
Potassium: 3.6 mmol/L (ref 3.5–5.1)
SODIUM: 141 mmol/L (ref 135–145)

## 2016-10-28 LAB — PROTIME-INR
INR: 0.98
PROTHROMBIN TIME: 13 s (ref 11.4–15.2)

## 2016-10-28 LAB — TYPE AND SCREEN
ABO/RH(D): O POS
ANTIBODY SCREEN: NEGATIVE

## 2016-10-28 LAB — ABO/RH: ABO/RH(D): O POS

## 2016-10-28 MED ORDER — CEPHALEXIN 500 MG PO CAPS: 500.0000 mg | ORAL_CAPSULE | Freq: Three times a day (TID) | ORAL | 0 refills | Status: AC

## 2016-10-28 MED ORDER — TETANUS-DIPHTH-ACELL PERTUSSIS 5-2.5-18.5 LF-MCG/0.5 IM SUSP
0.5000 mL | Freq: Once | INTRAMUSCULAR | Status: AC
Start: 2016-10-28 — End: 2016-10-28
  Administered 2016-10-28: 0.5 mL via INTRAMUSCULAR
  Filled 2016-10-28: qty 0.5
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
## 2016-10-28 NOTE — ED Notes (Signed)
Xrays completed
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
## 2016-10-29 LAB — PATHOLOGIST SMEAR REVIEW

## 2017-04-04 ENCOUNTER — Emergency Department (HOSPITAL_COMMUNITY)
Admission: EM | Admit: 2017-04-04 | Discharge: 2017-04-04 | Disposition: A | Discharge status: Home / Self Care | Payer: No Typology Code available for payment source | Attending: Emergency Medicine | Admitting: Emergency Medicine

## 2017-04-04 ENCOUNTER — Encounter (HOSPITAL_COMMUNITY): Payer: Self-pay | Admitting: Emergency Medicine

## 2017-04-04 DIAGNOSIS — S199XXA Unspecified injury of neck, initial encounter: Secondary | ICD-10-CM | POA: Diagnosis present

## 2017-04-04 DIAGNOSIS — Y999 Unspecified external cause status: Secondary | ICD-10-CM | POA: Insufficient documentation

## 2017-04-04 DIAGNOSIS — Y929 Unspecified place or not applicable: Secondary | ICD-10-CM | POA: Diagnosis not present

## 2017-04-04 DIAGNOSIS — Y939 Activity, unspecified: Secondary | ICD-10-CM | POA: Diagnosis not present

## 2017-04-04 DIAGNOSIS — S46811A Strain of other muscles, fascia and tendons at shoulder and upper arm level, right arm, initial encounter: Secondary | ICD-10-CM | POA: Diagnosis not present

## 2017-04-04 MED ORDER — METHOCARBAMOL 500 MG PO TABS: 500.0000 mg | ORAL_TABLET | Freq: Two times a day (BID) | ORAL | 0 refills | Status: DC
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
## 2017-04-04 NOTE — Discharge Instructions (Signed)
Please read attached information. If you experience any new or worsening signs or symptoms please return to the emergency room for evaluation. Please follow-up with your primary care provider or specialist as discussed. Please use medication prescribed only as directed and discontinue taking if you have any concerning signs or symptoms.   °

## 2017-07-25 ENCOUNTER — Emergency Department (HOSPITAL_COMMUNITY): Payer: Self-pay

## 2017-07-25 ENCOUNTER — Other Ambulatory Visit: Payer: Self-pay

## 2017-07-25 ENCOUNTER — Emergency Department (HOSPITAL_COMMUNITY)
Admission: EM | Admit: 2017-07-25 | Discharge: 2017-07-25 | Disposition: A | Discharge status: Home / Self Care | Payer: Self-pay | Attending: Emergency Medicine | Admitting: Emergency Medicine

## 2017-07-25 ENCOUNTER — Encounter (HOSPITAL_COMMUNITY): Payer: Self-pay

## 2017-07-25 DIAGNOSIS — R0789 Other chest pain: Secondary | ICD-10-CM | POA: Insufficient documentation

## 2017-07-25 DIAGNOSIS — R42 Dizziness and giddiness: Secondary | ICD-10-CM | POA: Insufficient documentation

## 2017-07-25 DIAGNOSIS — R079 Chest pain, unspecified: Secondary | ICD-10-CM

## 2017-07-25 LAB — BASIC METABOLIC PANEL
Anion gap: 13 (ref 5–15)
BUN: 12 mg/dL (ref 6–20)
CO2: 22 mmol/L (ref 22–32)
Calcium: 8.8 mg/dL — ABNORMAL LOW (ref 8.9–10.3)
Chloride: 105 mmol/L (ref 101–111)
Creatinine, Ser: 1.02 mg/dL (ref 0.61–1.24)
GFR calc non Af Amer: >60 mL/min (ref >60)
Glucose, Bld: 132 mg/dL — ABNORMAL HIGH (ref 65–99)
POTASSIUM: 3.8 mmol/L (ref 3.5–5.1)
SODIUM: 140 mmol/L (ref 135–145)

## 2017-07-25 LAB — I-STAT TROPONIN, ED
TROPONIN I, POC: 0.01 ng/mL (ref 0.00–0.08)
Troponin i, poc: 0 ng/mL (ref 0.00–0.08)

## 2017-07-25 LAB — CBC
HEMATOCRIT: 41.6 % (ref 39.0–52.0)
Hemoglobin: 13.9 g/dL (ref 13.0–17.0)
MCH: 31.4 pg (ref 26.0–34.0)
MCHC: 33.4 g/dL (ref 30.0–36.0)
MCV: 93.9 fL (ref 78.0–100.0)
PLATELETS: 326 10*3/uL (ref 150–400)
RBC: 4.43 MIL/uL (ref 4.22–5.81)
RDW: 14.5 % (ref 11.5–15.5)
WBC: 13 10*3/uL — AB (ref 4.0–10.5)

## 2017-07-25 LAB — D-DIMER, QUANTITATIVE: D-Dimer, Quant: <0.27 ug/mL-FEU (ref 0.00–0.50)

## 2017-07-25 MED ORDER — ONDANSETRON HCL 4 MG/2ML IJ SOLN
4.0000 mg | Freq: Once | INTRAMUSCULAR | Status: AC
  Administered 2017-07-25: 4 mg via INTRAVENOUS
  Filled 2017-07-25: qty 2

## 2017-07-25 MED ORDER — SODIUM CHLORIDE 0.9 % IV BOLUS (SEPSIS)
1000.0000 mL | Freq: Once | INTRAVENOUS | Status: AC
  Administered 2017-07-25: 1000 mL via INTRAVENOUS

## 2017-07-25 MED ORDER — ONDANSETRON 4 MG PO TBDP: 4.0000 mg | ORAL_TABLET | Freq: Three times a day (TID) | ORAL | 0 refills | Status: DC | PRN

## 2017-07-25 NOTE — ED Notes (Signed)
Given sprite for PO challenge. Nausea is better, but is still dizzy.

## 2017-07-25 NOTE — Discharge Instructions (Addendum)
Your lab results were encouraging today. Your xray did show an enlarged heart, but no other abnormalities. Be sure to stay well hydrated by drinking at least 64-72 oz of water per day.  May use the zofran, has needed for nausea and/or vomiting. Please follow-up with a primary care provider and cardiologist as possible for any further evaluation of this issue.   Return to the ED for any recurrent or worsening symptoms.

## 2017-07-25 NOTE — ED Triage Notes (Signed)
Pt presents for evaluation of chest pain with dizziness. Pt appears panicked, states he feels like hes gonna die and he thinks he has alcohol poisoning. Pt is AxO x4, does not appear intoxicated. Pt reports pain has eased off at this time. NO hx of same. Woke up around 0930 with pain.

## 2017-07-25 NOTE — ED Notes (Signed)
Pt up to use there bathroom, heard vomiting. Reports to RN ecstasy use last night might be making him sick. PA made aware.

## 2017-07-25 NOTE — ED Provider Notes (Signed)
[facility] EMERGENCY DEPARTMENT Provider Note   CSN: [id number] Arrival date & time: 07/25/17  4098     History   Chief Complaint Chief Complaint  Patient presents with  . Chest Pain    HPI Logan Patton is a 32 y.o. male.  HPI   Logan Patton is a 32 y.o. male, patient with no pertinent past medical, presenting to the ED with chest pain beginning around 930am as he was at rest at home. Pain was a tightness across the entire chest, lasted for about 20 minutes, 9/10, nonradiating. Accompanied by lightheadedness. Has not had this issue before.  Denies N/V/D, abdominal pain, constipation, fever/chills, SOB, cough, diaphoresis, or any other complaints.   No family history of cardiac disease. Patient initially denied heavy alcohol use recently and denied drug use, however, patient then told the nurse that he did use ecstasy last night.   History reviewed. No pertinent past medical history.  There are no active problems to display for this patient.   History reviewed. No pertinent surgical history.     Home Medications    Prior to Admission medications   Medication Sig Start Date End Date Taking? Authorizing Provider  acetaminophen (TYLENOL) 500 MG tablet Take 1,000 mg by mouth every 6 (six) hours as needed for mild pain.   Yes [provider]  loratadine (CLARITIN) 10 MG tablet Take 10 mg by mouth daily as needed for allergies.   Yes [provider]  ranitidine (ZANTAC) 150 MG tablet Take 150 mg by mouth as needed for heartburn.   Yes [provider]  chlorhexidine (HIBICLENS) 4 % external liquid Apply 60 mLs (4 application total) topically daily as needed. Patient not taking: Reported on 10/28/2016 06/16/12   Cleora Fleet, MD  methocarbamol (ROBAXIN) 500 MG tablet Take 1 tablet (500 mg total) by mouth 2 (two) times daily. Patient not taking: Reported on 07/25/2017 04/04/17   Hedges, Tinnie Gens, PA-C  ondansetron (ZOFRAN ODT)  4 MG disintegrating tablet Take 1 tablet (4 mg total) by mouth every 8 (eight) hours as needed for nausea or vomiting. 07/25/17   Trea Latner, Hillard Danker, PA-C    Family History No family history on file.  Social History Social History   Tobacco Use  . Smoking status: Never Smoker  . Smokeless tobacco: Never Used  Substance Use Topics  . Alcohol use: Yes    Comment: occasionally  . Drug use: Yes    Types: Marijuana     Allergies   Patient has no known allergies.   Review of Systems Review of Systems  Constitutional: Negative for chills, diaphoresis and fever.  Eyes: Negative for visual disturbance.  Respiratory: Negative for cough and shortness of breath.   Cardiovascular: Positive for chest pain. Negative for palpitations and leg swelling.  Gastrointestinal: Negative for abdominal pain, diarrhea, nausea and vomiting.  Musculoskeletal: Negative for back pain.  Neurological: Positive for light-headedness. Negative for syncope, weakness and numbness.  All other systems reviewed and are negative.    Physical Exam Updated Vital Signs BP 119/66 (BP Location: Left Arm)   Pulse 95   Temp 98.1 F (36.7 C) (Oral)   Resp 20   SpO2 97%   Physical Exam  Constitutional: He appears well-developed and well-nourished. No distress.  HENT:  Head: Normocephalic and atraumatic.  Eyes: Conjunctivae are normal.  Neck: Neck supple.  Cardiovascular: Normal rate, regular rhythm, normal heart sounds and intact distal pulses.  Pulmonary/Chest: Effort normal and breath sounds normal.  No respiratory distress.  Abdominal: Soft. There is no tenderness. There is no guarding.  Musculoskeletal: He exhibits no edema.  Lymphadenopathy:    He has no cervical adenopathy.  Neurological: He is alert.  Skin: Skin is warm and dry. He is not diaphoretic.  Psychiatric: He has a normal mood and affect. His behavior is normal.  Nursing note and vitals reviewed.    ED Treatments / Results  Labs (all labs  ordered are listed, but only abnormal results are displayed) Labs Reviewed  BASIC METABOLIC PANEL - Abnormal; Notable for the following components:      Result Value   Glucose, Bld 132 (*)    Calcium 8.8 (*)    All other components within normal limits  CBC - Abnormal; Notable for the following components:   WBC 13.0 (*)    All other components within normal limits  D-DIMER, QUANTITATIVE (NOT AT [facility])  I-STAT TROPONIN, ED  I-STAT TROPONIN, ED    EKG  EKG Interpretation  Date/Time:  Sunday July 25 2017 09:55:02 EST Ventricular Rate:  112 PR Interval:  184 QRS Duration: 90 QT Interval:  316 QTC Calculation: 431 R Axis:   63 Text Interpretation:  Sinus tachycardia Nonspecific T wave abnormality Abnormal ECG mild T wave flattening inferiorly and in precordial leads, no previous EKG available for comparison Confirmed by Frederick Peers [phone]) on 07/25/2017 11:30:43 AM       Radiology Dg Chest 2 View  Result Date: 07/25/2017 CLINICAL DATA:  Chest pain and dizziness EXAM: CHEST  2 VIEW COMPARISON:  None. FINDINGS: Cardiac shadow is mildly enlarged. The lungs are well aerated bilaterally without focal infiltrate. No bony abnormality is seen. IMPRESSION: Cardiomegaly without acute abnormality. Electronically Signed   By: Alcide Clever M.D.   On: 07/25/2017 10:51    Procedures Procedures (including critical care time)  Medications Ordered in ED Medications  ondansetron (ZOFRAN) injection 4 mg (4 mg Intravenous Given 07/25/17 1407)  sodium chloride 0.9 % bolus 1,000 mL (0 mLs Intravenous Stopped 07/25/17 1522)     Initial Impression / Assessment and Plan / ED Course  I have reviewed the triage vital signs and the nursing notes.  Pertinent labs & imaging results that were available during my care of the patient were reviewed by me and considered in my medical decision making (see chart for details).  Clinical Course as of Jul 25 1702  Wynelle Link Jul 25, 2017  1534 Reevaluated patient  prior to discharge.  States he feels much better.  Currently symptom-free.  Return precautions discussed in detail.    [SJ]    Clinical Course User Index [SJ] Meily Glowacki C, PA-C    Patient presents following episode of chest pain.  Low suspicion for ACS.  HEART score is 2, indicating low risk for a cardiac event. Wells criteria score is 1.5, indicating low risk for PE. Cannot PERC due to patient's presenting pulse rate of 100.  Delta troponins negative.  D-dimer negative.  Patient voiced improvement following IV fluids. No EKG changes on serial EKGs. The patient was given instructions for home care as well as return precautions. Patient voices understanding of these instructions, accepts the plan, and is comfortable with discharge.  Vitals:   07/25/17 1145 07/25/17 1145 07/25/17 1400 07/25/17 1527  BP: 119/66 119/66 121/70 131/90  Pulse: 87 95 (!) 103 85  Resp: 11 20 (!) 22 16  Temp:      TempSrc:      SpO2: 95% 97% 100% 97%  Final Clinical Impressions(s) / ED Diagnoses   Final diagnoses:  Chest pain, unspecified type    ED Discharge Orders        Ordered    ondansetron (ZOFRAN ODT) 4 MG disintegrating tablet  Every 8 hours PRN     07/25/17 1516       Anselm PancoastJoy, Meriel Kelliher C, PA-C 07/25/17 1705    Little, Ambrose Finlandachel Morgan, MD 07/26/17 [phone]

## 2017-07-29 ENCOUNTER — Ambulatory Visit (INDEPENDENT_AMBULATORY_CARE_PROVIDER_SITE_OTHER): Payer: Self-pay | Admitting: Cardiology

## 2017-07-29 ENCOUNTER — Encounter: Payer: Self-pay | Admitting: Cardiology

## 2017-07-29 VITALS — BP 140/90 | HR 82 | Ht 76.0 in | Wt >= 6400 oz

## 2017-07-29 DIAGNOSIS — R030 Elevated blood-pressure reading, without diagnosis of hypertension: Secondary | ICD-10-CM

## 2017-07-29 DIAGNOSIS — Z6841 Body Mass Index (BMI) 40.0 and over, adult: Secondary | ICD-10-CM

## 2017-07-29 DIAGNOSIS — R0602 Shortness of breath: Secondary | ICD-10-CM

## 2017-07-29 NOTE — Progress Notes (Signed)
Cardiology Office Note   Date:  07/29/2017   ID:  Logan HindsRidge K Kiper, DOB 11/09/1985, MRN [id number]  PCP:  Patient, No Pcp Per  Cardiologist:   Peter SwazilandJordan, MD   Chief Complaint  Patient presents with  . Shortness of Breath  . Chest Pain      History of Present Illness: Logan Patton is a 32 y.o. male who is seen at the request of Dr. Frederick Peersachel Little for evaluation of chest pain and cardiomegaly. He was recently seen in the ED on 07/25/17 for evaluation of chest pain and dyspnea. He states he woke up SOB and a tight feeling in his chest. This lasted all day. He never had this before. Labs including D dimer and delta troponin were negative. Ecg showed nonspecific changes. CXR showed cardiomegaly without acute change. He was referred for further evaluation.  He states since his ED visit he has no recurrent dyspnea. He reports he has been big all his life. Aware that this is his major health issue. Eats out a lot with poor choices. Planning to cook more for himself. Smokes some marijuana. Uses some "light" party drugs like Ecstasy. Does not use cocaine, heroin, or crack. No known history of DM, HTN, HLD. Hasn't seen a doctor in years.     Past Medical History:  Diagnosis Date  . Hypertension     History reviewed. No pertinent surgical history.   Current Outpatient Medications  Medication Sig Dispense Refill  . acetaminophen (TYLENOL) 500 MG tablet Take 1,000 mg by mouth every 6 (six) hours as needed for mild pain.    Marland Kitchen. loratadine (CLARITIN) 10 MG tablet Take 10 mg by mouth daily as needed for allergies.     No current facility-administered medications for this visit.     Allergies:   Patient has no known allergies.    Social History:  The patient  reports that  has never smoked. he has never used smokeless tobacco. He reports that he drinks alcohol. He reports that he uses drugs. Drug: Marijuana.   Family History:  The patient's family history includes Hypertension in his  mother.    ROS:  Please see the history of present illness.   Otherwise, review of systems are positive for none.   All other systems are reviewed and negative.    PHYSICAL EXAM: Body mass index is 70.62 kg/m.  VS:  BP 140/90   Pulse 82   Ht 6\' 4"  (1.93 m)   Wt (!) 580 lb 3.2 oz (263.2 kg)   SpO2 91%   BMI 70.62 kg/m  , BMI Body mass index is 70.62 kg/m. GEN: Well nourished, well developed, in no acute distress  HEENT: normal  Neck: no JVD, carotid bruits, or masses Cardiac: RRR; no murmurs, rubs, or gallops,no edema  Respiratory:  clear to auscultation bilaterally, normal work of breathing GI: soft, nontender, nondistended, + BS MS: no deformity or atrophy  Skin: warm and dry, no rash Neuro:  Strength and sensation are intact Psych: euthymic mood, full affect   EKG:  EKG is not ordered today. The ekg ordered 07/25/17 demonstrates sinus tachy, rate 112, nonspecific TWA. I have personally reviewed and interpreted this study.    Recent Labs: 07/25/2017: BUN 12; Creatinine, Ser 1.02; Hemoglobin 13.9; Platelets 326; Potassium 3.8; Sodium 140    Lipid Panel No results found for: CHOL, TRIG, HDL, CHOLHDL, VLDL, LDLCALC, LDLDIRECT    Wt Readings from Last 3 Encounters:  07/29/17 (!) 580 lb 3.2  oz (263.2 kg)  10/28/16 (!) 520 lb (235.9 kg)  11/02/15 (!) 400 lb (181.4 kg)      Other studies Reviewed: Additional studies/ records that were reviewed today include: none. Review of the above records demonstrates: N/A   ASSESSMENT AND PLAN:  1.  Chest tightness and dyspnea. Suspect this is more related to his super morbid obesity. May have a component of OSA and hypoventilation. I reviewed CXR and CM is borderline/mild. No evidence of CHF on exam or Xray. No further cardiac work up needed. He would not be a candidate for Echo anyway since he exceeds weight limit on table and would have poor sound wave transmission. Needs to focus more on weight loss program 2. 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 Reports multiple family members have had bariatric surgery. Will refer to primary care to assist in weight loss program 3. Elevated BS- prediabetic. 4. Elevated BP. Noted today. BP in ED normal. Work on healthy diet and increased activity.  I will see PRN.   Current medicines are reviewed at length with the patient today.  The patient does not have concerns regarding medicines.  The following changes have been made:  no change  Labs/ tests ordered today include:  No orders of the defined types were placed in this encounter.    Signed, Peter Swaziland, MD  07/29/2017 4:25 PM    [facility] [address], [place], [place], [address] Phone [phone], Fax [phone]

## 2017-07-29 NOTE — Patient Instructions (Signed)
Referral to Primary M.D. for weight loss
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
## 2018-12-15 ENCOUNTER — Emergency Department (HOSPITAL_COMMUNITY): Payer: Self-pay

## 2018-12-15 ENCOUNTER — Other Ambulatory Visit: Payer: Self-pay

## 2018-12-15 ENCOUNTER — Emergency Department (HOSPITAL_COMMUNITY)
Admission: EM | Admit: 2018-12-15 | Discharge: 2018-12-15 | Disposition: A | Discharge status: Home / Self Care | Payer: Self-pay | Attending: Emergency Medicine | Admitting: Emergency Medicine

## 2018-12-15 ENCOUNTER — Encounter (HOSPITAL_COMMUNITY): Payer: Self-pay | Admitting: Emergency Medicine

## 2018-12-15 DIAGNOSIS — R600 Localized edema: Secondary | ICD-10-CM | POA: Insufficient documentation

## 2018-12-15 DIAGNOSIS — M25561 Pain in right knee: Secondary | ICD-10-CM | POA: Insufficient documentation

## 2018-12-15 DIAGNOSIS — Y9289 Other specified places as the place of occurrence of the external cause: Secondary | ICD-10-CM | POA: Insufficient documentation

## 2018-12-15 DIAGNOSIS — Y9389 Activity, other specified: Secondary | ICD-10-CM | POA: Insufficient documentation

## 2018-12-15 DIAGNOSIS — Y999 Unspecified external cause status: Secondary | ICD-10-CM | POA: Insufficient documentation

## 2018-12-15 DIAGNOSIS — W101XXA Fall (on)(from) sidewalk curb, initial encounter: Secondary | ICD-10-CM | POA: Insufficient documentation

## 2018-12-15 DIAGNOSIS — I10 Essential (primary) hypertension: Secondary | ICD-10-CM | POA: Insufficient documentation

## 2018-12-15 MED ORDER — IBUPROFEN 800 MG PO TABS: 800.0000 mg | ORAL_TABLET | Freq: Three times a day (TID) | ORAL | 0 refills | Status: DC

## 2018-12-15 NOTE — ED Notes (Signed)
Patient verbalizes understanding of discharge instructions. Opportunity for questioning and answers were provided. Armband removed by staff, pt discharged from ED.  

## 2018-12-15 NOTE — Discharge Instructions (Signed)
Rest and ice knee as needed for pain/swelling (ice on for 15 mins 3 times a day).   If  pain is persistent after NSAID treatment, please follow up with PCP/ORTHO.

## 2018-12-15 NOTE — ED Triage Notes (Signed)
Pt arrives to ED from home with complaints of right leg pain and swelling. Patient stated that he fell on his right knee three weeks ago and it has not gotten better.
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
## 2020-04-09 ENCOUNTER — Ambulatory Visit (HOSPITAL_COMMUNITY)
Admission: EM | Admit: 2020-04-09 | Discharge: 2020-04-09 | Disposition: A | Discharge status: Home / Self Care | Payer: Self-pay | Attending: Family Medicine | Admitting: Family Medicine

## 2020-04-09 ENCOUNTER — Other Ambulatory Visit: Payer: Self-pay

## 2020-04-09 ENCOUNTER — Encounter (HOSPITAL_COMMUNITY): Payer: Self-pay

## 2020-04-09 DIAGNOSIS — E669 Obesity, unspecified: Secondary | ICD-10-CM | POA: Insufficient documentation

## 2020-04-09 DIAGNOSIS — Z202 Contact with and (suspected) exposure to infections with a predominantly sexual mode of transmission: Secondary | ICD-10-CM | POA: Insufficient documentation

## 2020-04-09 LAB — CBG MONITORING, ED: Glucose-Capillary: 155 mg/dL — ABNORMAL HIGH (ref 70–99)

## 2020-04-09 LAB — HIV ANTIBODY (ROUTINE TESTING W REFLEX): HIV Screen 4th Generation wRfx: NONREACTIVE

## 2020-04-09 MED ORDER — METRONIDAZOLE 500 MG PO TABS
2000.0000 mg | ORAL_TABLET | Freq: Once | ORAL | Status: AC
  Administered 2020-04-09: 2000 mg via ORAL

## 2020-04-09 MED ORDER — METRONIDAZOLE 500 MG PO TABS
ORAL_TABLET | ORAL | Status: AC
  Filled 2020-04-09: qty 4

## 2020-04-09 NOTE — ED Triage Notes (Signed)
Pt presents for STD testing after partner was tested and treated for trichmonisis .

## 2020-04-09 NOTE — Discharge Instructions (Signed)
Check for results on My Chart You will be contacted if any of the tests are positive Safe sex/condoms recommended for infection prevention

## 2020-04-09 NOTE — ED Provider Notes (Signed)
[facility]    CSN: [id number] Arrival date & time: 04/09/20  1139      History   Chief Complaint Chief Complaint  Patient presents with  . STD Exposure    HPI Logan Patton is a 34 y.o. male.   HPI  Patient is here for treatment of trichomonas Would also like STD testing He states that his girlfriend is pregnant, and she tested positive for trichomonas at her first OB visit. Patient is asymptomatic  Past Medical History:  Diagnosis Date  . Hypertension     There are no problems to display for this patient.   History reviewed. No pertinent surgical history.    Home Medications    Prior to Admission medications   Medication Sig Start Date End Date Taking? Authorizing Provider  acetaminophen (TYLENOL) 500 MG tablet Take 1,000 mg by mouth every 6 (six) hours as needed for mild pain.    [provider]  ibuprofen (ADVIL) 800 MG tablet Take 1 tablet (800 mg total) by mouth 3 (three) times daily. 12/15/18   Nodal, Amber K, PA-C  loratadine (CLARITIN) 10 MG tablet Take 10 mg by mouth daily as needed for allergies.    [provider]    Family History Family History  Problem Relation Age of Onset  . Hypertension Mother     Social History Social History   Tobacco Use  . Smoking status: Never Smoker  . Smokeless tobacco: Never Used  Substance Use Topics  . Alcohol use: Yes    Comment: occasionally  . Drug use: Yes    Types: Marijuana     Allergies   Patient has no known allergies.   Review of Systems Review of Systems See HPI  Physical Exam Triage Vital Signs ED Triage Vitals  Enc Vitals Group     BP 04/09/20 1412 (!) 154/113     Pulse Rate 04/09/20 1412 87     Resp 04/09/20 1412 20     Temp 04/09/20 1412 98.7 F (37.1 C)     Temp Source 04/09/20 1412 Oral     SpO2 04/09/20 1412 100 %     Weight --      Height --      Head Circumference --      Peak Flow --      Pain Score 04/09/20 1410 0     Pain Loc --       Pain Edu? --      Excl. in GC? --    No data found.  Updated Vital Signs BP (!) 154/113 (BP Location: Left Arm)   Pulse 87   Temp 98.7 F (37.1 C) (Oral)   Resp 20   SpO2 100%      Physical Exam Constitutional:      General: He is not in acute distress.    Appearance: He is well-developed. He is obese.  HENT:     Head: Normocephalic and atraumatic.     Mouth/Throat:     Comments: Mask in place Eyes:     Conjunctiva/sclera: Conjunctivae normal.     Pupils: Pupils are equal, round, and reactive to light.  Cardiovascular:     Rate and Rhythm: Normal rate.  Pulmonary:     Effort: Pulmonary effort is normal. No respiratory distress.  Abdominal:     Palpations: Abdomen is soft.     Comments: Large pannus  Musculoskeletal:        General: Normal range of motion.  Cervical back: Normal range of motion.  Skin:    General: Skin is warm and dry.  Neurological:     General: No focal deficit present.     Mental Status: He is alert.  Psychiatric:        Mood and Affect: Mood normal.        Behavior: Behavior normal.      UC Treatments / Results  Labs (all labs ordered are listed, but only abnormal results are displayed) Labs Reviewed  RPR  HIV ANTIBODY (ROUTINE TESTING W REFLEX)  CBG MONITORING, ED  CYTOLOGY, (ORAL, ANAL, URETHRAL) ANCILLARY ONLY    EKG   Radiology No results found.  Procedures Procedures (including critical care time)  Medications Ordered in UC Medications  metroNIDAZOLE (FLAGYL) tablet 2,000 mg (2,000 mg Oral Given 04/09/20 1443)    Initial Impression / Assessment and Plan / UC Course  I have reviewed the triage vital signs and the nursing notes.  Pertinent labs & imaging results that were available during my care of the patient were reviewed by me and considered in my medical decision making (see chart for details).      Final Clinical Impressions(s) / UC Diagnoses   Final diagnoses:  Exposure to STD  Obesity, unspecified  classification, unspecified obesity type, unspecified whether serious comorbidity present     Discharge Instructions     Check for results on My Chart You will be contacted if any of the tests are positive Safe sex/condoms recommended for infection prevention   ED Prescriptions    None     PDMP not reviewed this encounter.   Eustace Moore, MD 04/09/20 1459

## 2020-04-10 LAB — CYTOLOGY, (ORAL, ANAL, URETHRAL) ANCILLARY ONLY
Chlamydia: NEGATIVE
Comment: NEGATIVE
Comment: NEGATIVE
Comment: NORMAL
Neisseria Gonorrhea: NEGATIVE
Trichomonas: POSITIVE — AB

## 2020-04-10 LAB — RPR: RPR Ser Ql: NONREACTIVE

## 2020-12-05 ENCOUNTER — Ambulatory Visit (INDEPENDENT_AMBULATORY_CARE_PROVIDER_SITE_OTHER): Payer: Self-pay

## 2020-12-05 ENCOUNTER — Encounter (HOSPITAL_COMMUNITY): Payer: Self-pay

## 2020-12-05 ENCOUNTER — Other Ambulatory Visit: Payer: Self-pay

## 2020-12-05 ENCOUNTER — Ambulatory Visit (HOSPITAL_COMMUNITY)
Admission: EM | Admit: 2020-12-05 | Discharge: 2020-12-05 | Disposition: A | Discharge status: Home / Self Care | Payer: Self-pay | Attending: Family Medicine | Admitting: Family Medicine

## 2020-12-05 DIAGNOSIS — M25561 Pain in right knee: Secondary | ICD-10-CM

## 2020-12-05 MED ORDER — DICLOFENAC SODIUM 75 MG PO TBEC: 75.0000 mg | DELAYED_RELEASE_TABLET | Freq: Two times a day (BID) | ORAL | 0 refills | Status: AC

## 2020-12-05 MED ORDER — PREDNISONE 20 MG PO TABS: 40.0000 mg | ORAL_TABLET | Freq: Every day | ORAL | 0 refills | Status: DC

## 2020-12-05 NOTE — ED Triage Notes (Signed)
Pt in with c/o right knee pain x 2 months  Pt states he fell a while ago but never got his knee checked   Pt has not been taking otc meds

## 2020-12-05 NOTE — ED Provider Notes (Signed)
[facility]   [id number] 12/05/20 Arrival Time: 2542  ASSESSMENT & PLAN:  1. Acute pain of right knee    I have personally viewed the imaging studies ordered this visit. No fracture appreciated.  Begin trial of: Meds ordered this encounter  Medications   diclofenac (VOLTAREN) 75 MG EC tablet    Sig: Take 1 tablet (75 mg total) by mouth 2 (two) times daily.    Dispense:  14 tablet    Refill:  0   predniSONE (DELTASONE) 20 MG tablet    Sig: Take 2 tablets (40 mg total) by mouth daily.    Dispense:  10 tablet    Refill:  0     Orders Placed This Encounter  Procedures   DG Knee Complete 4 Views Right    Recommend:  Follow-up Information     Schedule an appointment as soon as possible for a visit  with [facility].   Contact information: [address] [place] [address] [phone]                Reviewed expectations re: course of current medical issues. Questions answered. Outlined signs and symptoms indicating need for more acute intervention. Patient verbalized understanding. After Visit Summary given.  SUBJECTIVE: History from: patient. Logan Patton is a 35 y.o. male who reports fairly persistent moderate pain of his right knee; described as aching; without radiation. First noted:  2 mo ago s/p fall . Symptoms have waxed and waned but are worse overall since beginning. Aggravating factors:  position change from sitting to standing . Alleviating factors: rest. Associated symptoms: none reported. Extremity sensation changes or weakness: none. Self treatment:  OTC analgesics without much help .  History of similar: no.  History reviewed. No pertinent surgical history.    OBJECTIVE:  Vitals:   12/05/20 1016  BP: (!) 143/89  Pulse: 84  Resp: (!) 21  Temp: 98.9 F (37.2 C)  TempSrc: Temporal  SpO2: 100%    General appearance: alert; no distress HEENT: [place]; AT Neck: supple with  FROM Resp: unlabored respirations Extremities: RLE: body habitus greatly limits exam; warm with well perfused appearance; poorly localized mild to moderate tenderness over right anterior/medial knee; without gross deformities; swelling: none; bruising: none; knee ROM: normal Skin: warm and dry; no visible rashes Neurologic: gait normal; normal sensation and strength of RLE Psychological: alert and cooperative; normal mood and affect  Imaging: DG Knee Complete 4 Views Right  Result Date: 12/05/2020 CLINICAL DATA:  Medial right knee pain after fall 2 months ago EXAM: RIGHT KNEE - COMPLETE 4+ VIEW COMPARISON:  12/15/2018 FINDINGS: No evidence of fracture or dislocation. No large joint effusion. Mild medial compartment joint space narrowing, similar to prior. Soft tissues are unremarkable. IMPRESSION: Mild medial compartment joint space narrowing.  No acute findings. Electronically Signed   By: Duanne Guess D.O.   On: 12/05/2020 11:09      No Known Allergies  Past Medical History:  Diagnosis Date   Hypertension    Social History   Socioeconomic History   Marital status: Single    Spouse name: Not on file   Number of children: Not on file   Years of education: Not on file   Highest education level: Not on file  Occupational History   Occupation: unemployed  Tobacco Use   Smoking status: Never   Smokeless tobacco: Never  Substance and Sexual Activity   Alcohol use: Yes  Comment: occasionally   Drug use: Yes    Types: Marijuana   Sexual activity: Not on file  Other Topics Concern   Not on file  Social History Narrative   Not on file   Social Determinants of Health   Financial Resource Strain: Not on file  Food Insecurity: Not on file  Transportation Needs: Not on file  Physical Activity: Not on file  Stress: Not on file  Social Connections: Not on file   Family History  Problem Relation Age of Onset   Hypertension Mother    History reviewed. No pertinent  surgical history.     Mardella Layman, MD 12/05/20 1113

## 2020-12-21 ENCOUNTER — Encounter (HOSPITAL_BASED_OUTPATIENT_CLINIC_OR_DEPARTMENT_OTHER): Payer: Self-pay | Admitting: *Deleted

## 2020-12-21 ENCOUNTER — Emergency Department (HOSPITAL_BASED_OUTPATIENT_CLINIC_OR_DEPARTMENT_OTHER)
Admission: EM | Admit: 2020-12-21 | Discharge: 2020-12-21 | Disposition: A | Discharge status: Home / Self Care | Payer: Self-pay | Attending: Emergency Medicine | Admitting: Emergency Medicine

## 2020-12-21 ENCOUNTER — Other Ambulatory Visit: Payer: Self-pay

## 2020-12-21 DIAGNOSIS — M25561 Pain in right knee: Secondary | ICD-10-CM | POA: Insufficient documentation

## 2020-12-21 DIAGNOSIS — I10 Essential (primary) hypertension: Secondary | ICD-10-CM | POA: Insufficient documentation

## 2020-12-21 DIAGNOSIS — M79604 Pain in right leg: Secondary | ICD-10-CM | POA: Insufficient documentation

## 2020-12-21 NOTE — Discharge Instructions (Addendum)
ER x-ray that was recently done was reassuring.  Do not feel as if we need a new one now.  I also do not feel a new course of steroids will help.  Follow-up with orthopedic surgery.  NSAIDs such as Motrin may help.  Tylenol also may help.

## 2020-12-21 NOTE — ED Provider Notes (Signed)
[facility] EMERGENCY DEPT Provider Note   CSN: [id number] Arrival date & time: 12/21/20  1623     History Chief Complaint  Patient presents with   Knee Pain   Leg Pain    Rt    Logan Patton is a 35 y.o. male.   Knee Pain Leg Pain Patient presents for right knee and leg pain.  Continued pain.  Seen 2 weeks ago and given steroids and diclofenac.  Had some mild relief with the pain but pain continued.Has had pain on and off for the last 3 months.  Appears to have had pain in this knee before that 2.  No new injury today or recently.  No numbness weakness.  Worse with walking.  Has not followed up with orthopedic surgery.    Past Medical History:  Diagnosis Date   Hypertension     There are no problems to display for this patient.   History reviewed. No pertinent surgical history.     Family History  Problem Relation Age of Onset   Hypertension Mother     Social History   Tobacco Use   Smoking status: Never   Smokeless tobacco: Never  Vaping Use   Vaping Use: Never used  Substance Use Topics   Alcohol use: Yes    Comment: occasionally   Drug use: Yes    Types: Marijuana    Home Medications Prior to Admission medications   Medication Sig Start Date End Date Taking? Authorizing Provider  loratadine (CLARITIN) 10 MG tablet Take 10 mg by mouth daily as needed for allergies.   Yes [provider]  diclofenac (VOLTAREN) 75 MG EC tablet Take 1 tablet (75 mg total) by mouth 2 (two) times daily. 12/05/20   Mardella Layman, MD  predniSONE (DELTASONE) 20 MG tablet Take 2 tablets (40 mg total) by mouth daily. 12/05/20   Mardella Layman, MD    Allergies    Patient has no known allergies.  Review of Systems   Review of Systems  Constitutional:  Negative for appetite change.  Respiratory:  Negative for shortness of breath.   Cardiovascular:  Negative for chest pain.  Musculoskeletal:        Right leg/knee pain.  Skin:  Negative for wound.   Neurological:  Negative for weakness.  Psychiatric/Behavioral:  Negative for confusion.    Physical Exam Updated Vital Signs BP 139/60 (BP Location: Right Leg)   Pulse 87   Temp 97.8 F (36.6 C) (Oral)   Resp 18   Ht 6\' 5"  (1.956 m)   Wt (!) 204.1 kg   SpO2 98%   BMI 53.36 kg/m   Physical Exam Vitals and nursing note reviewed.  Constitutional:      Appearance: He is obese.  HENT:     Head: Atraumatic.  Cardiovascular:     Rate and Rhythm: Regular rhythm.  Abdominal:     Tenderness: There is no abdominal tenderness.  Musculoskeletal:        General: Tenderness present.     Cervical back: Neck supple.     Comments: Mild tenderness over right knee.  Somewhat decreased range of motion.  No effusion felt but difficult due to body habitus.  Neurological:     General: No focal deficit present.     Mental Status: He is alert.    ED Results / Procedures / Treatments   Labs (all labs ordered are listed, but only abnormal results are displayed) Labs Reviewed - No data to display  EKG  None  Radiology No results found.  Procedures Procedures   Medications Ordered in ED Medications - No data to display  ED Course  I have reviewed the triage vital signs and the nursing notes.  Pertinent labs & imaging results that were available during my care of the patient were reviewed by me and considered in my medical decision making (see chart for details).    MDM Rules/Calculators/A&P                          Patient with right knee pain.  Somewhat acute on chronic.  No new injury.  Had already been on Voltaren and steroids.  Steroids within the last 2 weeks and do not feel as if he would benefit from another course of them.  Did not follow-up with Ortho.  I feel like this is the neck step.  Patient is too heavy for our crutches here and we do not have a knee immobilizer that fits him.  X-ray did not show acute fracture previously.  Do not think we need to reimage.  Do not feel  an effusion that I can tap.  Orthopedic follow-up Final Clinical Impression(s) / ED Diagnoses Final diagnoses:  Right knee pain, unspecified chronicity    Rx / DC Orders ED Discharge Orders     None        Benjiman Core, MD 12/21/20 2020

## 2020-12-21 NOTE — ED Triage Notes (Signed)
Pt continues to have pain in his rt knee and lower leg, seen 6/9 given ortho referral, has not followed up. Tylenol taken without relief.

## 2021-03-13 ENCOUNTER — Other Ambulatory Visit: Payer: Self-pay

## 2021-03-13 ENCOUNTER — Emergency Department (HOSPITAL_BASED_OUTPATIENT_CLINIC_OR_DEPARTMENT_OTHER)
Admission: EM | Admit: 2021-03-13 | Discharge: 2021-03-13 | Disposition: A | Discharge status: Home / Self Care | Payer: Self-pay | Attending: Emergency Medicine | Admitting: Emergency Medicine

## 2021-03-13 ENCOUNTER — Encounter (HOSPITAL_BASED_OUTPATIENT_CLINIC_OR_DEPARTMENT_OTHER): Payer: Self-pay | Admitting: Obstetrics and Gynecology

## 2021-03-13 DIAGNOSIS — G8929 Other chronic pain: Secondary | ICD-10-CM | POA: Insufficient documentation

## 2021-03-13 DIAGNOSIS — M25561 Pain in right knee: Secondary | ICD-10-CM | POA: Insufficient documentation

## 2021-03-13 DIAGNOSIS — I10 Essential (primary) hypertension: Secondary | ICD-10-CM | POA: Insufficient documentation

## 2021-03-13 MED ORDER — PREDNISONE 10 MG (21) PO TBPK: ORAL_TABLET | Freq: Every day | ORAL | 0 refills | Status: DC

## 2021-03-13 MED ORDER — DICLOFENAC SODIUM 1 % EX GEL: 2.0000 g | Freq: Four times a day (QID) | CUTANEOUS | 0 refills | Status: AC

## 2021-03-13 NOTE — ED Provider Notes (Signed)
[facility] EMERGENCY DEPT Provider Note   CSN: [id number] Arrival date & time: 03/13/21  1105     History Chief Complaint  Patient presents with   Knee Pain    Logan Patton is a 35 y.o. male presenting to the ED with a chief complaint of right knee pain.  He has had intermittent right knee pain for the past several months.  In the past steroids have helped him.  He is also had a steroid injection placed about 3 to 4 months ago and is looking for referral for the same.  He denies any injury or trauma.  He was told in the past that he may need a knee replacement.  States that this pain is similar to the pain he is experienced in the past.  He states that his x-rays have all been negative in the past.  Denies any numbness or weakness, leg or joint swelling.       Past Medical History:  Diagnosis Date   Hypertension     There are no problems to display for this patient.   History reviewed. No pertinent surgical history.     Family History  Problem Relation Age of Onset   Hypertension Mother     Social History   Tobacco Use   Smoking status: Never   Smokeless tobacco: Never  Vaping Use   Vaping Use: Never used  Substance Use Topics   Alcohol use: Yes    Comment: occasionally   Drug use: Yes    Types: Marijuana    Home Medications Prior to Admission medications   Medication Sig Start Date End Date Taking? Authorizing Provider  diclofenac Sodium (VOLTAREN) 1 % GEL Apply 2 g topically 4 (four) times daily. 03/13/21  Yes Travious Vanover, PA-C  predniSONE (STERAPRED UNI-PAK 21 TAB) 10 MG (21) TBPK tablet Take by mouth daily. Take 6 tabs by mouth daily  for 2 days, then 5 tabs for 2 days, then 4 tabs for 2 days, then 3 tabs for 2 days, 2 tabs for 2 days, then 1 tab by mouth daily for 2 days 03/13/21  Yes Mia Winthrop, PA-C  diclofenac (VOLTAREN) 75 MG EC tablet Take 1 tablet (75 mg total) by mouth 2 (two) times daily. Patient not taking: Reported on 03/13/2021  12/05/20   Mardella Layman, MD  loratadine (CLARITIN) 10 MG tablet Take 10 mg by mouth daily as needed for allergies. Patient not taking: Reported on 03/13/2021    [provider]    Allergies    Patient has no known allergies.  Review of Systems   Review of Systems  Constitutional:  Negative for chills and fever.  Musculoskeletal:  Positive for arthralgias. Negative for myalgias.  Skin:  Negative for wound.  Neurological:  Negative for weakness and numbness.   Physical Exam Updated Vital Signs BP (!) 145/89   Pulse 91   Temp 98 F (36.7 C)   Resp 18   SpO2 99%   Physical Exam Vitals and nursing note reviewed.  Constitutional:      General: He is not in acute distress.    Appearance: He is well-developed. He is obese. He is not diaphoretic.  HENT:     Head: Normocephalic and atraumatic.  Eyes:     General: No scleral icterus.    Conjunctiva/sclera: Conjunctivae normal.  Pulmonary:     Effort: Pulmonary effort is normal. No respiratory distress.  Musculoskeletal:     Cervical back: Normal range of motion.  Comments: No erythema, edema or warmth of right knee.  Normal range of motion of right knee.  Strength 5/5 in bilateral upper and lower extremities.  No joint effusion palpated.  2+ DP pulse palpated bilaterally.  Skin:    Findings: No rash.  Neurological:     Mental Status: He is alert.    ED Results / Procedures / Treatments   Labs (all labs ordered are listed, but only abnormal results are displayed) Labs Reviewed - No data to display  EKG None  Radiology No results found.  Procedures Procedures   Medications Ordered in ED Medications - No data to display  ED Course  I have reviewed the triage vital signs and the nursing notes.  Pertinent labs & imaging results that were available during my care of the patient were reviewed by me and considered in my medical decision making (see chart for details).    MDM Rules/Calculators/A&P                            35 year old male presenting to the ED for right knee pain.  This has been chronic in nature for several months.  In the past steroids have provided relief.  He is requesting the same.  Also requesting referral to orthopedist for possible joint injection.  No injury or trauma.  This feels similar to his prior flareups.  On exam there is no overlying skin changes.  He exhibiting normal range of motion of right knee.  Area is neurovascularly intact.  No swelling noted.  He is ambulatory here.  Suspect symptoms could be due to strain versus arthritis.  Reviewed prior x-rays which have been negative for acute abnormality.  No need for additional imaging at this time as his pain feels like his prior pain and he denying any injury.  Will treat with steroids and Voltaren gel.  Will give referral to orthopedist.  I doubt infectious cause based on his physical exam findings.  Return precautions given   Patient is hemodynamically stable, in NAD, and able to ambulate in the ED. Evaluation does not show pathology that would require ongoing emergent intervention or inpatient treatment. I explained the diagnosis to the patient. Pain has been managed and has no complaints prior to discharge. Patient is comfortable with above plan and is stable for discharge at this time. All questions were answered prior to disposition. Strict return precautions for returning to the ED were discussed. Encouraged follow up with PCP.   An After Visit Summary was printed and given to the patient.   Portions of this note were generated with Scientist, clinical (histocompatibility and immunogenetics). Dictation errors may occur despite best attempts at proofreading.  Final Clinical Impression(s) / ED Diagnoses Final diagnoses:  Chronic pain of right knee    Rx / DC Orders ED Discharge Orders          Ordered    predniSONE (STERAPRED UNI-PAK 21 TAB) 10 MG (21) TBPK tablet  Daily        03/13/21 1336    diclofenac Sodium (VOLTAREN) 1 % GEL  4 times  daily        03/13/21 1336             Dietrich Pates, PA-C 03/13/21 1339    Alvira Monday, MD 03/14/21 1116

## 2021-03-13 NOTE — ED Triage Notes (Signed)
Patient reports to the ER for knee pain on the right side. Patient reports he is potentially going to need a knee replacement but comes today requesting a shot of steroids for pain relief. Reports last shot x4 months ago.

## 2021-03-13 NOTE — Discharge Instructions (Signed)
Take the medications to help with your symptoms. Follow-up with orthopedic provider listed below. Return to the ER if you start to experience worsening pain, swelling, injury or weakness.

## 2022-11-25 ENCOUNTER — Emergency Department (HOSPITAL_BASED_OUTPATIENT_CLINIC_OR_DEPARTMENT_OTHER)
Admission: EM | Admit: 2022-11-25 | Discharge: 2022-11-25 | Disposition: A | Discharge status: Home / Self Care | Payer: Self-pay | Attending: Emergency Medicine | Admitting: Emergency Medicine

## 2022-11-25 ENCOUNTER — Encounter (HOSPITAL_BASED_OUTPATIENT_CLINIC_OR_DEPARTMENT_OTHER): Payer: Self-pay | Admitting: Emergency Medicine

## 2022-11-25 ENCOUNTER — Other Ambulatory Visit: Payer: Self-pay

## 2022-11-25 DIAGNOSIS — E119 Type 2 diabetes mellitus without complications: Secondary | ICD-10-CM

## 2022-11-25 DIAGNOSIS — Z7984 Long term (current) use of oral hypoglycemic drugs: Secondary | ICD-10-CM | POA: Insufficient documentation

## 2022-11-25 DIAGNOSIS — E1165 Type 2 diabetes mellitus with hyperglycemia: Secondary | ICD-10-CM | POA: Insufficient documentation

## 2022-11-25 DIAGNOSIS — B379 Candidiasis, unspecified: Secondary | ICD-10-CM | POA: Insufficient documentation

## 2022-11-25 LAB — BASIC METABOLIC PANEL
Anion gap: 12 (ref 5–15)
BUN: 12 mg/dL (ref 6–20)
CO2: 23 mmol/L (ref 22–32)
Calcium: 8.9 mg/dL (ref 8.9–10.3)
Chloride: 92 mmol/L — ABNORMAL LOW (ref 98–111)
Creatinine, Ser: 0.93 mg/dL (ref 0.61–1.24)
GFR, Estimated: >60 mL/min (ref >60)
Glucose, Bld: 554 mg/dL (ref 70–99)
Potassium: 3.9 mmol/L (ref 3.5–5.1)
Sodium: 127 mmol/L — ABNORMAL LOW (ref 135–145)

## 2022-11-25 LAB — CBC WITH DIFFERENTIAL/PLATELET
Abs Immature Granulocytes: 0.02 10*3/uL (ref 0.00–0.07)
Basophils Absolute: 0.1 10*3/uL (ref 0.0–0.1)
Basophils Relative: 1 %
Eosinophils Absolute: 0.3 10*3/uL (ref 0.0–0.5)
Eosinophils Relative: 4 %
HCT: 39.7 % (ref 39.0–52.0)
Hemoglobin: 13.5 g/dL (ref 13.0–17.0)
Immature Granulocytes: 0 %
Lymphocytes Relative: 45 %
Lymphs Abs: 3.9 10*3/uL (ref 0.7–4.0)
MCH: 30.1 pg (ref 26.0–34.0)
MCHC: 34 g/dL (ref 30.0–36.0)
MCV: 88.4 fL (ref 80.0–100.0)
Monocytes Absolute: 0.8 10*3/uL (ref 0.1–1.0)
Monocytes Relative: 9 %
Neutro Abs: 3.6 10*3/uL (ref 1.7–7.7)
Neutrophils Relative %: 41 %
Platelets: 236 10*3/uL (ref 150–400)
RBC: 4.49 MIL/uL (ref 4.22–5.81)
RDW: 15 % (ref 11.5–15.5)
WBC: 8.7 10*3/uL (ref 4.0–10.5)
nRBC: 0 % (ref 0.0–0.2)

## 2022-11-25 LAB — URINALYSIS, ROUTINE W REFLEX MICROSCOPIC
Bacteria, UA: NONE SEEN
Bilirubin Urine: NEGATIVE
Glucose, UA: >1000 mg/dL — AB
Hgb urine dipstick: NEGATIVE
Ketones, ur: 15 mg/dL — AB
Leukocytes,Ua: NEGATIVE
Nitrite: NEGATIVE
Protein, ur: NEGATIVE mg/dL
Specific Gravity, Urine: 1.029 (ref 1.005–1.030)
pH: 5 (ref 5.0–8.0)

## 2022-11-25 LAB — RPR: RPR Ser Ql: NONREACTIVE

## 2022-11-25 LAB — CBG MONITORING, ED
Glucose-Capillary: 542 mg/dL (ref 70–99)
Glucose-Capillary: 420 mg/dL — ABNORMAL HIGH (ref 70–99)

## 2022-11-25 MED ORDER — FLUCONAZOLE 150 MG PO TABS
300.0000 mg | ORAL_TABLET | Freq: Once | ORAL | Status: AC
  Administered 2022-11-25: 300 mg via ORAL
  Filled 2022-11-25: qty 2

## 2022-11-25 MED ORDER — INSULIN ASPART 100 UNIT/ML IJ SOLN
10.0000 [IU] | Freq: Once | INTRAMUSCULAR | Status: AC
  Administered 2022-11-25: 10 [IU] via SUBCUTANEOUS

## 2022-11-25 MED ORDER — FLUCONAZOLE 150 MG PO TABS: 150.0000 mg | ORAL_TABLET | Freq: Once | ORAL | Status: DC

## 2022-11-25 MED ORDER — FLUCONAZOLE 150 MG PO TABS: 150.0000 mg | ORAL_TABLET | Freq: Once | ORAL | 0 refills | Status: AC

## 2022-11-25 MED ORDER — SODIUM CHLORIDE 0.9 % IV BOLUS
1000.0000 mL | Freq: Once | INTRAVENOUS | Status: AC
  Administered 2022-11-25: 1000 mL via INTRAVENOUS

## 2022-11-25 MED ORDER — METFORMIN HCL 500 MG PO TABS: 500.0000 mg | ORAL_TABLET | Freq: Two times a day (BID) | ORAL | 0 refills | Status: DC

## 2022-11-25 NOTE — ED Provider Notes (Signed)
[place] EMERGENCY DEPARTMENT AT [facility]  Provider Note  CSN: [id number] Arrival date & time: 11/25/22 0032  History Chief Complaint  Patient presents with   Rash    Logan Patton is a 37 y.o. male with history of morbid obesity reports an itchy rash in his groin area for the last few weeks, not responding well to topical antifungal treatments. He would also like to be checked for STI although he is not having any dysuria, discharge or known exposure. He reports some increased urinary frequency recently. He is not a known diabetic but had an elevated CBG 150s at an UC visit in 2021. No follouwp since then.    Home Medications Prior to Admission medications   Medication Sig Start Date End Date Taking? Authorizing Provider  fluconazole (DIFLUCAN) 150 MG tablet Take 1 tablet (150 mg total) by mouth once for 1 dose. 12/02/22 12/02/22 Yes Pollyann Savoy, MD  metFORMIN (GLUCOPHAGE) 500 MG tablet Take 1 tablet (500 mg total) by mouth 2 (two) times daily with a meal. 11/25/22 12/25/22 Yes Pollyann Savoy, MD  diclofenac (VOLTAREN) 75 MG EC tablet Take 1 tablet (75 mg total) by mouth 2 (two) times daily. Patient not taking: Reported on 03/13/2021 12/05/20   Mardella Layman, MD  diclofenac Sodium (VOLTAREN) 1 % GEL Apply 2 g topically 4 (four) times daily. 03/13/21   Khatri, Hina, PA-C  loratadine (CLARITIN) 10 MG tablet Take 10 mg by mouth daily as needed for allergies. Patient not taking: Reported on 03/13/2021    [provider]  predniSONE (STERAPRED UNI-PAK 21 TAB) 10 MG (21) TBPK tablet Take by mouth daily. Take 6 tabs by mouth daily  for 2 days, then 5 tabs for 2 days, then 4 tabs for 2 days, then 3 tabs for 2 days, 2 tabs for 2 days, then 1 tab by mouth daily for 2 days 03/13/21   Dietrich Pates, PA-C     Allergies    Patient has no known allergies.   Review of Systems   Review of Systems Please see HPI for pertinent positives and negatives  Physical Exam BP (!)  160/92 (BP Location: Right Arm)   Pulse (!) 104   Temp 98.3 F (36.8 C) (Oral)   Resp 18   SpO2 97%   Physical Exam Vitals and nursing note reviewed. Exam conducted with a chaperone present.  Constitutional:      Appearance: Normal appearance. He is obese.  HENT:     Head: Normocephalic and atraumatic.     Nose: Nose normal.     Mouth/Throat:     Mouth: Mucous membranes are moist.  Eyes:     Extraocular Movements: Extraocular movements intact.     Conjunctiva/sclera: Conjunctivae normal.  Cardiovascular:     Rate and Rhythm: Normal rate.  Pulmonary:     Effort: Pulmonary effort is normal.     Breath sounds: Normal breath sounds.  Abdominal:     General: Abdomen is flat.     Palpations: Abdomen is soft.     Tenderness: There is no abdominal tenderness.  Genitourinary:    Comments: Candidal rash in groin, no signs of deep tissue infection/fournier's  Musculoskeletal:        General: No swelling. Normal range of motion.     Cervical back: Neck supple.  Skin:    General: Skin is warm and dry.  Neurological:     General: No focal deficit present.     Mental Status: He  is alert.  Psychiatric:        Mood and Affect: Mood normal.     ED Results / Procedures / Treatments   EKG None  Procedures Procedures  Medications Ordered in the ED Medications  sodium chloride 0.9 % bolus 1,000 mL (0 mLs Intravenous Stopped 11/25/22 0413)  fluconazole (DIFLUCAN) tablet 300 mg (300 mg Oral Given 11/25/22 0320)  insulin aspart (novoLOG) injection 10 Units (10 Units Subcutaneous Given 11/25/22 0328)    Initial Impression and Plan  Patient here for fungal skin infection, noted to be markedly hyperglycemic on CBG. UA with glucose, but no signs of infection. Will check labs, give IVF and reassess.   ED Course   Clinical Course as of 11/25/22 0448  Wed Nov 25, 2022  0303 CBC is normal.  [CS]  0319 BMP with hyperglycemia, no signs of acidosis. Will give insulin subQ and recheck after  IVF.  [CS]  0439 CBG improving. Discussed inpatient vs outpatient management and the patient prefers to go home. Will begin metformin, referral to [facility] for outpatient follow up. Encouraged to avoid sugar and limit carbs at home. Rx for diflucan to take in 1 week to help with groin rash.  [CS]    Clinical Course User Index [CS] Pollyann Savoy, MD     MDM Rules/Calculators/A&P Medical Decision Making Problems Addressed: Candida infection: acute illness or injury New onset type 2 diabetes mellitus (HCC): undiagnosed new problem with uncertain prognosis  Amount and/or Complexity of Data Reviewed Labs: ordered. Decision-making details documented in ED Course.  Risk Prescription drug management. Decision regarding hospitalization.     Final Clinical Impression(s) / ED Diagnoses Final diagnoses:  New onset type 2 diabetes mellitus (HCC)  Candida infection    Rx / DC Orders ED Discharge Orders          Ordered    fluconazole (DIFLUCAN) 150 MG tablet   Once        11/25/22 0448    metFORMIN (GLUCOPHAGE) 500 MG tablet  2 times daily with meals        11/25/22 0448             Pollyann Savoy, MD 11/25/22 [phone]

## 2022-11-25 NOTE — ED Triage Notes (Signed)
"  I just need a check up"  Reports having a rash on his "private" Denies burning or any issues urinating Notice about 10 day ago Requesting Std check

## 2022-11-26 LAB — GC/CHLAMYDIA PROBE AMP (~~LOC~~) NOT AT ARMC
Chlamydia: NEGATIVE
Comment: NEGATIVE
Comment: NORMAL
Neisseria Gonorrhea: NEGATIVE

## 2022-11-29 ENCOUNTER — Emergency Department (HOSPITAL_BASED_OUTPATIENT_CLINIC_OR_DEPARTMENT_OTHER)
Admission: EM | Admit: 2022-11-29 | Discharge: 2022-11-29 | Disposition: A | Discharge status: Home / Self Care | Payer: Self-pay | Attending: Emergency Medicine | Admitting: Emergency Medicine

## 2022-11-29 ENCOUNTER — Other Ambulatory Visit: Payer: Self-pay

## 2022-11-29 ENCOUNTER — Encounter (HOSPITAL_BASED_OUTPATIENT_CLINIC_OR_DEPARTMENT_OTHER): Payer: Self-pay

## 2022-11-29 DIAGNOSIS — N481 Balanitis: Secondary | ICD-10-CM | POA: Insufficient documentation

## 2022-11-29 DIAGNOSIS — Z7984 Long term (current) use of oral hypoglycemic drugs: Secondary | ICD-10-CM | POA: Insufficient documentation

## 2022-11-29 DIAGNOSIS — E119 Type 2 diabetes mellitus without complications: Secondary | ICD-10-CM | POA: Insufficient documentation

## 2022-11-29 DIAGNOSIS — Z79899 Other long term (current) drug therapy: Secondary | ICD-10-CM | POA: Insufficient documentation

## 2022-11-29 DIAGNOSIS — I10 Essential (primary) hypertension: Secondary | ICD-10-CM | POA: Insufficient documentation

## 2022-11-29 MED ORDER — CLOTRIMAZOLE 1 % EX CREA: TOPICAL_CREAM | CUTANEOUS | 0 refills | Status: AC

## 2022-11-29 MED ORDER — FLUCONAZOLE 200 MG PO TABS
200.0000 mg | ORAL_TABLET | Freq: Once | ORAL | Status: DC
  Filled 2022-11-29: qty 1

## 2022-11-29 MED ORDER — FLUCONAZOLE 150 MG PO TABS
150.0000 mg | ORAL_TABLET | Freq: Once | ORAL | Status: AC
  Administered 2022-11-29: 150 mg via ORAL
  Filled 2022-11-29: qty 1

## 2022-11-29 NOTE — ED Provider Notes (Signed)
EMERGENCY DEPARTMENT AT [facility] Provider Note   CSN: [id number] Arrival date & time: 11/29/22  1052     History  Chief Complaint  Patient presents with   Rash    Logan Patton is a 37 y.o. male.  Patient is a 37 year old male with a history of hypertension and recent diagnosis of diabetes who had been seen in the emergency room not long ago and diagnosed with candidal infection in the groin.  Patient reports he received a dose of fluconazole and then he has been using hydrocortisone on the area since that time.  He feels like the symptoms are getting worse.  He is having a lot of irritation and burning in the area.  He denies systemic symptoms.  The history is provided by the patient.  Rash      Home Medications Prior to Admission medications   Medication Sig Start Date End Date Taking? Authorizing Provider  clotrimazole (LOTRIMIN) 1 % cream Apply to affected area 3 times daily 11/29/22  Yes Gwyneth Sprout, MD  diclofenac (VOLTAREN) 75 MG EC tablet Take 1 tablet (75 mg total) by mouth 2 (two) times daily. Patient not taking: Reported on 03/13/2021 12/05/20   Mardella Layman, MD  diclofenac Sodium (VOLTAREN) 1 % GEL Apply 2 g topically 4 (four) times daily. 03/13/21   Khatri, Hina, PA-C  fluconazole (DIFLUCAN) 150 MG tablet Take 1 tablet (150 mg total) by mouth once for 1 dose. 12/02/22 12/02/22  Pollyann Savoy, MD  loratadine (CLARITIN) 10 MG tablet Take 10 mg by mouth daily as needed for allergies. Patient not taking: Reported on 03/13/2021    [provider]  metFORMIN (GLUCOPHAGE) 500 MG tablet Take 1 tablet (500 mg total) by mouth 2 (two) times daily with a meal. 11/25/22 12/25/22  Pollyann Savoy, MD  predniSONE (STERAPRED UNI-PAK 21 TAB) 10 MG (21) TBPK tablet Take by mouth daily. Take 6 tabs by mouth daily  for 2 days, then 5 tabs for 2 days, then 4 tabs for 2 days, then 3 tabs for 2 days, 2 tabs for 2 days, then 1 tab by mouth daily for 2 days  03/13/21   Dietrich Pates, PA-C      Allergies    Patient has no known allergies.    Review of Systems   Review of Systems  Skin:  Positive for rash.    Physical Exam Updated Vital Signs BP (!) 152/85 (BP Location: Left Arm)   Pulse 91   Temp 97.9 F (36.6 C) (Oral)   Resp 18   Ht 6\' 5"  (1.956 m)   Wt (!) 204.1 kg   SpO2 99%   BMI 53.36 kg/m  Physical Exam Vitals and nursing note reviewed.  HENT:     Head: Normocephalic.  Cardiovascular:     Rate and Rhythm: Normal rate.  Pulmonary:     Effort: Pulmonary effort is normal.  Genitourinary:    Penis: Uncircumcised.      Comments: Irritation, skin peeling, lesions present over the foreskin and at the head of the penis Neurological:     Mental Status: Mental status is at baseline.  Psychiatric:        Mood and Affect: Mood normal.     ED Results / Procedures / Treatments   Labs (all labs ordered are listed, but only abnormal results are displayed) Labs Reviewed - No data to display  EKG None  Radiology No results found.  Procedures Procedures    Medications  Ordered in ED Medications  fluconazole (DIFLUCAN) tablet 150 mg (has no administration in time range)    ED Course/ Medical Decision Making/ A&P                             Medical Decision Making Risk Prescription drug management.   Patient presenting today with symptoms most classic for balanitis.  This is in the setting of recent diagnosis of diabetes and patient has been putting hydrocortisone cream on the area which is most likely why it is getting worse.  Discussed with patient that that is not what he should be using and he was given a second dose of Diflucan and given a prescription for clotrimazole.  No evidence of scrotal abscesses or infection otherwise.        Final Clinical Impression(s) / ED Diagnoses Final diagnoses:  Balanitis    Rx / DC Orders ED Discharge Orders          Ordered    clotrimazole (LOTRIMIN) 1 % cream         11/29/22 1300              Gwyneth Sprout, MD 11/29/22 1333

## 2022-11-29 NOTE — ED Triage Notes (Signed)
Patient here POV from Home.  Endorses having a "Yeast Infection" to Groin Area for 10 Days. Seeks Re-Evaluation as it has worsened since. Given Medication when patient was evaluated 7 days ago.   No Fevers. No Dysuria.   NAD Noted during Triage. A&Ox4. Gcs 15. Ambulatory/

## 2022-11-29 NOTE — Discharge Instructions (Signed)
Do not use any hydrocortisone or neosporin to the area.  Clean the area well with soap and water and get dry and then apply cream.

## 2022-11-29 NOTE — ED Notes (Signed)
Pt given discharge instructions and reviewed prescriptions. Opportunities given for questions. Pt verbalizes understanding. Abem Shaddix R, RN 

## 2023-03-31 ENCOUNTER — Ambulatory Visit: Payer: Self-pay | Attending: Family Medicine | Admitting: Family Medicine

## 2023-03-31 ENCOUNTER — Other Ambulatory Visit: Payer: Self-pay

## 2023-03-31 ENCOUNTER — Other Ambulatory Visit (HOSPITAL_COMMUNITY): Payer: Self-pay

## 2023-03-31 ENCOUNTER — Encounter: Payer: Self-pay | Admitting: Family Medicine

## 2023-03-31 VITALS — BP 128/88 | HR 86 | Ht 77.0 in | Wt >= 6400 oz

## 2023-03-31 DIAGNOSIS — Z7984 Long term (current) use of oral hypoglycemic drugs: Secondary | ICD-10-CM

## 2023-03-31 DIAGNOSIS — E1169 Type 2 diabetes mellitus with other specified complication: Secondary | ICD-10-CM

## 2023-03-31 DIAGNOSIS — E119 Type 2 diabetes mellitus without complications: Secondary | ICD-10-CM

## 2023-03-31 DIAGNOSIS — Z1159 Encounter for screening for other viral diseases: Secondary | ICD-10-CM

## 2023-03-31 DIAGNOSIS — Z7985 Long-term (current) use of injectable non-insulin antidiabetic drugs: Secondary | ICD-10-CM

## 2023-03-31 DIAGNOSIS — Z794 Long term (current) use of insulin: Secondary | ICD-10-CM

## 2023-03-31 HISTORY — DX: Type 2 diabetes mellitus without complications: E11.9

## 2023-03-31 LAB — POCT GLYCOSYLATED HEMOGLOBIN (HGB A1C): HbA1c POC (<> result, manual entry): >15 % (ref 4.0–5.6)

## 2023-03-31 LAB — GLUCOSE, POCT (MANUAL RESULT ENTRY): POC Glucose: 395 mg/dL — AB (ref 70–99)

## 2023-03-31 MED ORDER — GLIPIZIDE 5 MG PO TABS
5.0000 mg | ORAL_TABLET | Freq: Two times a day (BID) | ORAL | 6 refills | Status: DC
  Filled 2023-03-31: qty 60, 30d supply, fill #0
  Filled 2023-09-02 – 2023-10-22 (×2): qty 60, 30d supply, fill #1
  Filled 2023-11-24 – 2023-11-25 (×2): qty 60, 30d supply, fill #2
  Filled 2024-01-04 (×2): qty 60, 30d supply, fill #3

## 2023-03-31 MED ORDER — OZEMPIC (0.25 OR 0.5 MG/DOSE) 2 MG/3ML ~~LOC~~ SOPN
0.5000 mg | PEN_INJECTOR | SUBCUTANEOUS | 0 refills | Status: DC
  Filled 2023-03-31: qty 3, fill #0

## 2023-03-31 MED ORDER — METFORMIN HCL 500 MG PO TABS
1000.0000 mg | ORAL_TABLET | Freq: Two times a day (BID) | ORAL | 6 refills | Status: DC
  Filled 2023-03-31: qty 120, 30d supply, fill #0

## 2023-03-31 MED ORDER — TRUE METRIX METER W/DEVICE KIT
1.0000 | PACK | Freq: Three times a day (TID) | 0 refills | Status: AC
  Filled 2023-03-31: qty 1, 30d supply, fill #0

## 2023-03-31 MED ORDER — TRUEPLUS LANCETS 28G MISC
1.0000 | Freq: Three times a day (TID) | 12 refills | Status: AC
  Filled 2023-03-31: qty 100, 33d supply, fill #0
  Filled 2023-10-22: qty 100, 33d supply, fill #1
  Filled 2023-11-24 – 2024-01-04 (×3): qty 100, 33d supply, fill #2

## 2023-03-31 MED ORDER — OZEMPIC (0.25 OR 0.5 MG/DOSE) 2 MG/3ML ~~LOC~~ SOPN
0.2500 mg | PEN_INJECTOR | SUBCUTANEOUS | 0 refills | Status: DC
  Filled 2023-03-31: qty 3, 28d supply, fill #0
  Filled 2023-03-31: qty 2, 70d supply, fill #0

## 2023-03-31 MED ORDER — TRUE METRIX BLOOD GLUCOSE TEST VI STRP
ORAL_STRIP | 12 refills | Status: AC
  Filled 2023-03-31: qty 100, 33d supply, fill #0
  Filled 2023-10-22: qty 100, 33d supply, fill #1
  Filled 2023-11-24 – 2024-01-04 (×3): qty 100, 33d supply, fill #2

## 2023-03-31 MED ORDER — SEMAGLUTIDE (1 MG/DOSE) 4 MG/3ML ~~LOC~~ SOPN
1.0000 mg | PEN_INJECTOR | SUBCUTANEOUS | 3 refills | Status: AC
Start: 2023-03-31 — End: ?
  Filled 2023-03-31 – 2023-10-22 (×2): qty 3, 28d supply, fill #0
  Filled 2023-11-24 – 2023-11-25 (×2): qty 3, 28d supply, fill #1
  Filled 2024-01-04 (×2): qty 3, 28d supply, fill #2

## 2023-03-31 NOTE — Patient Instructions (Signed)

## 2023-03-31 NOTE — Progress Notes (Signed)
Subjective:  Patient ID: Logan Patton, male    DOB: 1986/03/19  Age: 37 y.o. MRN: [id number]  CC: Medical Management of Chronic Issues (A1C->15.0/CBG-395)   HPI Logan Patton is a 37 y.o. year old male with a history of type 2 diabetes mellitus (A1c greater than 15), morbid obesity, GERD here to establish care.  Interval History: Discussed the use of AI scribe software for clinical note transcription with the patient, who gave verbal consent to proceed.  He presents for a new patient visit. He has been self-managing his diabetes with metformin, obtained from his mother, on an inconsistent basis. He is unsure when he last had his own prescription. He reports a recent urgent care visit where his blood sugar was 520 and he was experiencing blurry vision. He was diagnosed with diabetes in late May or early June of this year. He reports some dietary changes and has lost 76 pounds. He denies having hypertension, despite it being listed in his chart, but confirms a history of reflux. He does not have medical coverage and has not been checking his blood sugars at home. He does not have a glucose meter.        Past Medical History:  Diagnosis Date   GERD (gastroesophageal reflux disease)    Type 2 diabetes mellitus (HCC) 03/31/2023    No past surgical history on file.  Family History  Problem Relation Age of Onset   Hypertension Mother     Social History   Socioeconomic History   Marital status: Single    Spouse name: Not on file   Number of children: Not on file   Years of education: Not on file   Highest education level: Not on file  Occupational History   Occupation: unemployed  Tobacco Use   Smoking status: Never   Smokeless tobacco: Never  Vaping Use   Vaping status: Never Used  Substance and Sexual Activity   Alcohol use: Yes    Comment: occasionally   Drug use: Not Currently    Types: Marijuana   Sexual activity: Yes  Other Topics Concern   Not on file  Social  History Narrative   Not on file   Social Determinants of Health   Financial Resource Strain: Not on file  Food Insecurity: Not on file  Transportation Needs: Not on file  Physical Activity: Not on file  Stress: Not on file  Social Connections: Not on file    No Known Allergies  Outpatient Medications Prior to Visit  Medication Sig Dispense Refill   clotrimazole (LOTRIMIN) 1 % cream Apply to affected area 3 times daily (Patient not taking: Reported on 03/31/2023) 15 g 0   diclofenac (VOLTAREN) 75 MG EC tablet Take 1 tablet (75 mg total) by mouth 2 (two) times daily. (Patient not taking: Reported on 03/13/2021) 14 tablet 0   diclofenac Sodium (VOLTAREN) 1 % GEL Apply 2 g topically 4 (four) times daily. (Patient not taking: Reported on 03/31/2023) 50 g 0   loratadine (CLARITIN) 10 MG tablet Take 10 mg by mouth daily as needed for allergies. (Patient not taking: Reported on 03/13/2021)     predniSONE (STERAPRED UNI-PAK 21 TAB) 10 MG (21) TBPK tablet Take by mouth daily. Take 6 tabs by mouth daily  for 2 days, then 5 tabs for 2 days, then 4 tabs for 2 days, then 3 tabs for 2 days, 2 tabs for 2 days, then 1 tab by mouth daily for 2 days (Patient not taking: Reported  on 03/31/2023) 42 tablet 0   metFORMIN (GLUCOPHAGE) 500 MG tablet Take 1 tablet (500 mg total) by mouth 2 (two) times daily with a meal. 60 tablet 0   No facility-administered medications prior to visit.     ROS Review of Systems  Constitutional:  Negative for activity change and appetite change.  HENT:  Negative for sinus pressure and sore throat.   Respiratory:  Negative for chest tightness, shortness of breath and wheezing.   Cardiovascular:  Negative for chest pain and palpitations.  Gastrointestinal:  Negative for abdominal distention, abdominal pain and constipation.  Genitourinary: Negative.   Musculoskeletal: Negative.   Psychiatric/Behavioral:  Negative for behavioral problems and dysphoric mood.     Objective:  BP  128/88   Pulse 86   Ht 6\' 5"  (1.956 m)   Wt (!) 554 lb 12.8 oz (251.7 kg)   SpO2 98%   BMI 65.79 kg/m      03/31/2023    9:15 AM 11/29/2022   11:26 AM 11/29/2022   11:16 AM  BP/Weight  Systolic BP 128  161  Diastolic BP 88  85  Wt. (Lbs) 554.8 449.96   BMI 65.79 kg/m2 53.36 kg/m2       Physical Exam Constitutional:      Appearance: He is well-developed. He is obese.  Cardiovascular:     Rate and Rhythm: Normal rate.     Heart sounds: Normal heart sounds. No murmur heard. Pulmonary:     Effort: Pulmonary effort is normal.     Breath sounds: Normal breath sounds. No wheezing or rales.  Chest:     Chest wall: No tenderness.  Abdominal:     General: Bowel sounds are normal. There is no distension.     Palpations: Abdomen is soft. There is no mass.     Tenderness: There is no abdominal tenderness.  Musculoskeletal:        General: Normal range of motion.     Right lower leg: No edema.     Left lower leg: No edema.  Neurological:     Mental Status: He is alert and oriented to person, place, and time.  Psychiatric:        Mood and Affect: Mood normal.        Latest Ref Rng & Units 11/25/2022    2:30 AM 07/25/2017    9:56 AM 10/28/2016    9:10 PM  CMP  Glucose 70 - 99 mg/dL 096  045  409   BUN 6 - 20 mg/dL 12  12  14    Creatinine 0.61 - 1.24 mg/dL 8.11  9.14  7.82   Sodium 135 - 145 mmol/L 127  140  141   Potassium 3.5 - 5.1 mmol/L 3.9  3.8  3.6   Chloride 98 - 111 mmol/L 92  105  108   CO2 22 - 32 mmol/L 23  22  21    Calcium 8.9 - 10.3 mg/dL 8.9  8.8  8.7     Lipid Panel  No results found for: "CHOL", "TRIG", "HDL", "CHOLHDL", "VLDL", "LDLCALC", "LDLDIRECT"  CBC    Component Value Date/Time   WBC 8.7 11/25/2022 0230   RBC 4.49 11/25/2022 0230   HGB 13.5 11/25/2022 0230   HCT 39.7 11/25/2022 0230   PLT 236 11/25/2022 0230   MCV 88.4 11/25/2022 0230   MCH 30.1 11/25/2022 0230   MCHC 34.0 11/25/2022 0230   RDW 15.0 11/25/2022 0230   LYMPHSABS 3.9 11/25/2022  0230   MONOABS 0.8  11/25/2022 0230   EOSABS 0.3 11/25/2022 0230   BASOSABS 0.1 11/25/2022 0230    Lab Results  Component Value Date   HGBA1C >15.0 03/31/2023    Assessment & Plan:      Type 2 Diabetes Mellitus Poorly controlled with A1c >15 and recent blood glucose of 395. Patient has been inconsistently taking Metformin. Discussed the need for additional medications and lifestyle modifications for better control. -Increase Metformin to 2 tablets BID. -Start Ozempic, with a plan to increase dose gradually over the next few weeks and we will titrate up to maximum tolerated dose.   -Add Glipizide to the regimen. -Order blood tests for cholesterol, kidney function, and liver function. -Advise patient to monitor blood glucose levels at home. -Schedule follow-up with pharmacist in 4 weeks to ensure understanding of medication regimen and dose adjustments.  Candidate for liberate study. -Counseled on Diabetic diet, my plate method, 161 minutes of moderate intensity exercise/week Blood sugar logs with fasting goals of 80-120 mg/dl, random of less than 096 and in the event of sugars less than 60 mg/dl or greater than 045 mg/dl encouraged to notify the clinic. Advised on the need for annual eye exams, annual foot exams, Pneumonia vaccine.    Morbid obesity Counseled on caloric restriction -Currently on GLP-1 RA which will be beneficial  General Health Maintenance -Advise patient to have an annual eye exam, urine test for microalbumin, and foot exam due to diabetes. -Encourage patient to continue with exercise and dietary modifications for weight loss and better diabetes control. -Refer patient to front desk for assistance with financial assistance paperwork.          Meds ordered this encounter  Medications   metFORMIN (GLUCOPHAGE) 500 MG tablet    Sig: Take 2 tablets (1,000 mg total) by mouth 2 (two) times daily with a meal.    Dispense:  120 tablet    Refill:  6   glucose  blood (TRUE METRIX BLOOD GLUCOSE TEST) test strip    Sig: Use 3 times daily    Dispense:  100 each    Refill:  12   Blood Glucose Monitoring Suppl (TRUE METRIX METER) w/Device KIT    Sig: Testing 3 (three) times daily before meals.    Dispense:  1 kit    Refill:  0   TRUEplus Lancets 28G MISC    Sig: Testing 3 (three) times daily before meals.    Dispense:  100 each    Refill:  12   glipiZIDE (GLUCOTROL) 5 MG tablet    Sig: Take 1 tablet (5 mg total) by mouth 2 (two) times daily before a meal.    Dispense:  60 tablet    Refill:  6   Semaglutide,0.25 or 0.5MG /DOS, (OZEMPIC, 0.25 OR 0.5 MG/DOSE,) 2 MG/3ML SOPN    Sig: Inject 0.25 mg into the skin once a week.    Dispense:  3 mL    Refill:  0   Semaglutide,0.25 or 0.5MG /DOS, (OZEMPIC, 0.25 OR 0.5 MG/DOSE,) 2 MG/3ML SOPN    Sig: Inject 0.5 mg into the skin once a week. For 4 weeks then increase to 1mg     Dispense:  3 mL    Refill:  0   Semaglutide, 1 MG/DOSE, 4 MG/3ML SOPN    Sig: Inject 1 mg as directed once a week.    Dispense:  3 mL    Refill:  3    Follow-up: Return in about 1 month (around 05/01/2023) for Blood pressure follow-up with Franky Macho,  Medical conditions with PCP, in 3 months.       Hoy Register, MD, FAAFP. [facility] [place], [place] [phone]   03/31/2023, 10:43 AM

## 2023-04-01 ENCOUNTER — Other Ambulatory Visit: Payer: Self-pay

## 2023-04-01 ENCOUNTER — Other Ambulatory Visit: Payer: Self-pay | Admitting: Family Medicine

## 2023-04-01 LAB — CMP14+EGFR
ALT: 17 [IU]/L (ref 0–44)
AST: 14 [IU]/L (ref 0–40)
Albumin: 4 g/dL — ABNORMAL LOW (ref 4.1–5.1)
Alkaline Phosphatase: 83 [IU]/L (ref 44–121)
BUN/Creatinine Ratio: 10 (ref 9–20)
BUN: 8 mg/dL (ref 6–20)
Bilirubin Total: 0.5 mg/dL (ref 0.0–1.2)
CO2: 22 mmol/L (ref 20–29)
Calcium: 9.5 mg/dL (ref 8.7–10.2)
Chloride: 97 mmol/L (ref 96–106)
Creatinine, Ser: 0.83 mg/dL (ref 0.76–1.27)
Globulin, Total: 3.3 g/dL (ref 1.5–4.5)
Glucose: 436 mg/dL — ABNORMAL HIGH (ref 70–99)
Potassium: 4.4 mmol/L (ref 3.5–5.2)
Sodium: 135 mmol/L (ref 134–144)
Total Protein: 7.3 g/dL (ref 6.0–8.5)
eGFR: 117 mL/min/{1.73_m2} (ref >59)

## 2023-04-01 LAB — LP+NON-HDL CHOLESTEROL
Cholesterol, Total: 238 mg/dL — ABNORMAL HIGH (ref 100–199)
HDL: 23 mg/dL — ABNORMAL LOW (ref >39)
LDL Chol Calc (NIH): 169 mg/dL — ABNORMAL HIGH (ref 0–99)
Total Non-HDL-Chol (LDL+VLDL): 215 mg/dL — ABNORMAL HIGH (ref 0–129)
Triglycerides: 240 mg/dL — ABNORMAL HIGH (ref 0–149)
VLDL Cholesterol Cal: 46 mg/dL — ABNORMAL HIGH (ref 5–40)

## 2023-04-01 LAB — MICROALBUMIN / CREATININE URINE RATIO
Creatinine, Urine: 55.3 mg/dL
Microalb/Creat Ratio: 10 mg/g{creat} (ref 0–29)
Microalbumin, Urine: 5.8 ug/mL

## 2023-04-01 LAB — HCV INTERPRETATION

## 2023-04-01 LAB — HCV AB W REFLEX TO QUANT PCR: HCV Ab: NONREACTIVE

## 2023-04-01 MED ORDER — ATORVASTATIN CALCIUM 20 MG PO TABS
20.0000 mg | ORAL_TABLET | Freq: Every day | ORAL | 1 refills | Status: DC
  Filled 2023-04-01: qty 90, 90d supply, fill #0

## 2023-04-02 ENCOUNTER — Other Ambulatory Visit: Payer: Self-pay

## 2023-04-05 ENCOUNTER — Other Ambulatory Visit: Payer: Self-pay

## 2023-04-07 ENCOUNTER — Telehealth: Payer: Self-pay | Admitting: Pharmacist

## 2023-04-07 NOTE — Telephone Encounter (Signed)
LIBERATE Study  Received referral for patient participation in the LIBERATE CGM Study. Contacted patient to discuss study and confirmed HIPAA identifiers. Confirmed patient was provided the LIBERATE Study Information Sheet and any questions were answered.   Confirmed that patient meets study criteria by having a diagnosis of Type 2 Diabetes, is not currently on insulin, and most recent A1c is >8%.  Patient provided verbal consent to participate in the study. Consent documented in electronic medical record.   - Confirmed that patient has a compatible smart phone to download [employer] 3 app. ([email or web address]) - Asked to download and create a [employer]ine [employer] account prior to first study visit.  - Scheduled first study visit on 04/20/23 @ 4PM. Confirmed patient has transportation to this appointment.  - Discussed use of MyChart in this study. Confirmed patient has an active MyChart account and is aware of their log in information.  Patient aware of pre-visit questionnaire that will be sent 2 days prior to their scheduled study visit and they will plan to complete before the visit.    Butch Penny, PharmD, Patsy [place], CPP Clinical Pharmacist [facility] & [facility] [phone]

## 2023-04-13 ENCOUNTER — Other Ambulatory Visit: Payer: Self-pay

## 2023-04-20 ENCOUNTER — Encounter: Payer: Self-pay | Admitting: Pharmacist

## 2023-05-04 ENCOUNTER — Encounter: Payer: Self-pay | Admitting: Pharmacist

## 2023-05-04 ENCOUNTER — Ambulatory Visit: Payer: Self-pay | Attending: Family Medicine | Admitting: Pharmacist

## 2023-05-04 DIAGNOSIS — Z794 Long term (current) use of insulin: Secondary | ICD-10-CM

## 2023-05-04 DIAGNOSIS — Z7984 Long term (current) use of oral hypoglycemic drugs: Secondary | ICD-10-CM

## 2023-05-04 DIAGNOSIS — Z7985 Long-term (current) use of injectable non-insulin antidiabetic drugs: Secondary | ICD-10-CM

## 2023-05-04 DIAGNOSIS — E1169 Type 2 diabetes mellitus with other specified complication: Secondary | ICD-10-CM

## 2023-05-04 LAB — POCT GLYCOSYLATED HEMOGLOBIN (HGB A1C): HbA1c, POC (controlled diabetic range): 12.5 % — AB (ref 0.0–7.0)

## 2023-05-04 NOTE — Progress Notes (Signed)
    S:     No chief complaint on file.  37 y.o. male who presents for diabetes evaluation, education, and management in the context of the LIBERATE Study.   PMH is significant for T2DM.  Patient was referred and last seen by Primary Care Provider, Dr. Alvis Lemmings, on 03/31/2023.   Today, patient arrives in good spirits and presents without any assistance.  Patient reports Diabetes was diagnosed in May of this year. Was diagnosed in the ED. He last saw Dr. Alvis Lemmings on 03/31/23 to establish care. Ozempic was started at that visit. Metformin dose was increased. Glipizide added as well. Pt has no known hx of ASCVD, CKD, or CHF. No known personal or family hx of thyroid cancer or pancreatitis.   Family/Social History:  -Fhx: HTN -Tobacco: never smoker  -Alcohol: none reported   Current diabetes medications include: glipizide 5 mg BID, Ozempic 0.25 mg weekly, metformin 1000 mg BID  Patient reports adherence to taking all medications as prescribed.   Insurance coverage: none  Patient denies hypoglycemic events.  Patient denies nocturia (nighttime urination).  Patient denies neuropathy (nerve pain). Patient denies visual changes. Patient reports self foot exams.   Patient reported dietary habits: information not covered at this visit.  Patient-reported exercise habits: information not covered at this visit.    O:  Lab Results  Component Value Date   HGBA1C 12.5 (A) 05/04/2023    There were no vitals filed for this visit.   Lipid Panel     Component Value Date/Time   CHOL 238 (H) 03/31/2023 0955   TRIG 240 (H) 03/31/2023 0955   HDL 23 (L) 03/31/2023 0955   LDLCALC 169 (H) 03/31/2023 0955    Clinical Atherosclerotic Cardiovascular Disease (ASCVD): No  The ASCVD Risk score (Arnett DK, et al., 2019) failed to calculate for the following reasons:   The 2019 ASCVD risk score is only valid for ages 49 to 39   Patient is participating in a Managed Medicaid Plan: no      A/P:  LIBERATE Study:  -Patient provided verbal consent to participate in the study. Consent documented in electronic medical record.  -Provided education on Libre 3 CGM. Collaborated to ensure Josephine Igo 3 app was downloaded on patient's phone. Educated on how to place sensor every 14 days, patient placed first sensor correctly and verbalized understanding of use, removal, and how to place next sensor. Discussed alarms. 8 sensors provided for a 3 month supply. Educated to contact the office if the sensor falls off early and replacements are needed before their next [employer].   Diabetes longstanding currently uncontrolled. A1c today is 12.5%, down from >15% 1 month ago. He is tolerating Ozempic well. Also adherent to glipizide and metformin. Patient is able to verbalize appropriate hypoglycemia management plan. -Increased dose of Ozempic to 0.5 mg weekly.  -Continue metformin and glipizide at current doses.  -Patient educated on purpose, proper use, and potential adverse effects of Ozempic.  -Extensively discussed pathophysiology of diabetes, recommended lifestyle interventions, dietary effects on blood sugar control.  -Counseled on s/sx of and management of hypoglycemia.  -Next A1c anticipated 07/2023.   Written patient instructions provided. Patient verbalized understanding of treatment plan.  Total time in face to face counseling 30 minutes.    Follow-up:  Pharmacist in 1 month.  Butch Penny, PharmD, Patsy [place], CPP Clinical Pharmacist [facility] [phone]

## 2023-05-11 ENCOUNTER — Telehealth: Payer: Self-pay | Admitting: Pharmacist

## 2023-05-11 NOTE — Telephone Encounter (Signed)
LIBERATE Study  Patient completed first study visit for the LIBERATE CGM Study. Contacted patient to discuss CGM tolerability. Confirmed HIPAA identifiers.   @CGMFLO @  Patient confirms Josephine Igo 3 sensors is working and glucose values are transmitting appropriately. Denies any questions or concerns.   - Confirmed visit on 06/03/2023. Confirmed patient has transportation to this appointment.  - Discussed use of MyChart in this study. Confirmed patient has an active MyChart account and is aware of their log in information.  Patient aware of pre-visit questionnaire that will be sent 2 days prior to their scheduled study visit and they will plan to complete before the visit.   Butch Penny, PharmD, Patsy [place], CPP Clinical Pharmacist [facility] [phone]

## 2023-05-17 ENCOUNTER — Other Ambulatory Visit: Payer: Self-pay

## 2023-06-03 ENCOUNTER — Encounter: Payer: Self-pay | Admitting: Pharmacist

## 2023-06-14 ENCOUNTER — Other Ambulatory Visit: Payer: Self-pay

## 2023-07-06 ENCOUNTER — Other Ambulatory Visit: Payer: Self-pay

## 2023-07-06 ENCOUNTER — Ambulatory Visit: Payer: Self-pay | Attending: Family Medicine | Admitting: Pharmacist

## 2023-07-06 ENCOUNTER — Encounter: Payer: Self-pay | Admitting: Pharmacist

## 2023-07-06 DIAGNOSIS — Z7985 Long-term (current) use of injectable non-insulin antidiabetic drugs: Secondary | ICD-10-CM

## 2023-07-06 DIAGNOSIS — Z7984 Long term (current) use of oral hypoglycemic drugs: Secondary | ICD-10-CM

## 2023-07-06 DIAGNOSIS — E1169 Type 2 diabetes mellitus with other specified complication: Secondary | ICD-10-CM

## 2023-07-06 MED ORDER — OZEMPIC (0.25 OR 0.5 MG/DOSE) 2 MG/3ML ~~LOC~~ SOPN
0.5000 mg | PEN_INJECTOR | SUBCUTANEOUS | 0 refills | Status: DC
Start: 2023-07-06 — End: 2023-09-02
  Filled 2023-07-06: qty 3, 28d supply, fill #0

## 2023-07-06 MED ORDER — METFORMIN HCL ER 500 MG PO TB24
500.0000 mg | ORAL_TABLET | Freq: Two times a day (BID) | ORAL | 2 refills | Status: DC
  Filled 2023-07-06: qty 60, 30d supply, fill #0

## 2023-07-06 NOTE — Progress Notes (Addendum)
    S:     No chief complaint on file.  38 y.o. male who presents for diabetes evaluation, education, and management. We also have him enrolled in LIBERATE.  PMH is significant for T2DM.  Patient was referred and last seen by Primary Care Provider, Dr. Delbert, on 03/31/2023.   Today, patient arrives in good spirits and presents without any assistance.  Patient reports Diabetes was diagnosed in May of this year. Was diagnosed in the ED. He last saw Dr. Newlin on 03/31/23 to establish care. Ozempic  was started at that visit. Metformin  dose was increased. Glipizide  added as well. Pt has no known hx of ASCVD, CKD, or CHF. No known personal or family hx of thyroid cancer or pancreatitis.   Since last viist, continuing to take Ozempic  0.25 mg weekly. Denies any NV, belly pain. Of note, taking metformin  regular release and endorses diarrhea. Reports adherence to glipizide . No other concerns for me today.   Family/Social History:  -Fhx: HTN -Tobacco: never smoker  -Alcohol: none reported   Current diabetes medications include: glipizide  5 mg BID, Ozempic  0.25 mg weekly, metformin  1000 mg BID  Patient reports adherence to taking all medications as prescribed.   Insurance coverage: none  Patient denies hypoglycemic events.  Patient denies nocturia (nighttime urination).  Patient denies neuropathy (nerve pain). Patient denies visual changes. Patient reports self foot exams.   Patient reported dietary habits:  -Holidays - dietary indiscretion   Patient-reported exercise habits: -Plays basketball   O:  Lab Results  Component Value Date   HGBA1C 12.5 (A) 05/04/2023    There were no vitals filed for this visit.   Lipid Panel     Component Value Date/Time   CHOL 238 (H) 03/31/2023 0955   TRIG 240 (H) 03/31/2023 0955   HDL 23 (L) 03/31/2023 0955   LDLCALC 169 (H) 03/31/2023 0955    Clinical Atherosclerotic Cardiovascular Disease (ASCVD): No  The ASCVD Risk score (Arnett DK,  et al., 2019) failed to calculate for the following reasons:   The 2019 ASCVD risk score is only valid for ages 58 to 10   Patient is participating in a Managed Medicaid Plan: no     A/P: Diabetes longstanding currently uncontrolled. A1c today is 12.5%, down from >15% 1 month ago. He is tolerating Ozempic  well. Also adherent to glipizide  and metformin . Patient is able to verbalize appropriate hypoglycemia management plan. -Increased dose of Ozempic  to 0.5 mg weekly.  -Continue metformin  and glipizide  at current doses.  -Patient educated on purpose, proper use, and potential adverse effects of Ozempic .  -Extensively discussed pathophysiology of diabetes, recommended lifestyle interventions, dietary effects on blood sugar control.  -Counseled on s/sx of and management of hypoglycemia.  -Next A1c anticipated 07/2023.   Written patient instructions provided. Patient verbalized understanding of treatment plan.  Total time in face to face counseling 30 minutes.    Follow-up:  Pharmacist in 1 month.  Herlene Fleeta Morris, PharmD, JAQUELINE, CPP Clinical Pharmacist [facility] & [facility] [phone]

## 2023-07-30 ENCOUNTER — Other Ambulatory Visit: Payer: Self-pay

## 2023-07-30 ENCOUNTER — Emergency Department (HOSPITAL_BASED_OUTPATIENT_CLINIC_OR_DEPARTMENT_OTHER): Payer: Self-pay | Admitting: Radiology

## 2023-07-30 DIAGNOSIS — R739 Hyperglycemia, unspecified: Secondary | ICD-10-CM | POA: Insufficient documentation

## 2023-07-30 DIAGNOSIS — T40715A Adverse effect of cannabis, initial encounter: Secondary | ICD-10-CM | POA: Insufficient documentation

## 2023-07-30 LAB — CBC
HCT: 41.3 % (ref 39.0–52.0)
Hemoglobin: 13.4 g/dL (ref 13.0–17.0)
MCH: 29.6 pg (ref 26.0–34.0)
MCHC: 32.4 g/dL (ref 30.0–36.0)
MCV: 91.4 fL (ref 80.0–100.0)
Platelets: 297 10*3/uL (ref 150–400)
RBC: 4.52 MIL/uL (ref 4.22–5.81)
RDW: 15.3 % (ref 11.5–15.5)
WBC: 10.6 10*3/uL — ABNORMAL HIGH (ref 4.0–10.5)
nRBC: 0 % (ref 0.0–0.2)

## 2023-07-30 LAB — BASIC METABOLIC PANEL
Anion gap: 9 (ref 5–15)
BUN: 14 mg/dL (ref 6–20)
CO2: 28 mmol/L (ref 22–32)
Calcium: 9.1 mg/dL (ref 8.9–10.3)
Chloride: 101 mmol/L (ref 98–111)
Creatinine, Ser: 0.8 mg/dL (ref 0.61–1.24)
GFR, Estimated: >60 mL/min (ref >60)
Glucose, Bld: 203 mg/dL — ABNORMAL HIGH (ref 70–99)
Potassium: 3.8 mmol/L (ref 3.5–5.1)
Sodium: 138 mmol/L (ref 135–145)

## 2023-07-30 LAB — TROPONIN I (HIGH SENSITIVITY): Troponin I (High Sensitivity): 3 ng/L (ref <18)

## 2023-07-30 LAB — CBG MONITORING, ED: Glucose-Capillary: 203 mg/dL — ABNORMAL HIGH (ref 70–99)

## 2023-07-30 NOTE — ED Triage Notes (Signed)
Pt POV reporting SOB and chest tightness after taking edibles, unaware of dosage.

## 2023-07-31 ENCOUNTER — Emergency Department (HOSPITAL_BASED_OUTPATIENT_CLINIC_OR_DEPARTMENT_OTHER)
Admission: EM | Admit: 2023-07-31 | Discharge: 2023-07-31 | Disposition: A | Discharge status: Home / Self Care | Payer: Self-pay | Attending: Emergency Medicine | Admitting: Emergency Medicine

## 2023-07-31 ENCOUNTER — Encounter (HOSPITAL_BASED_OUTPATIENT_CLINIC_OR_DEPARTMENT_OTHER): Payer: Self-pay

## 2023-07-31 DIAGNOSIS — R739 Hyperglycemia, unspecified: Secondary | ICD-10-CM

## 2023-07-31 DIAGNOSIS — T50905A Adverse effect of unspecified drugs, medicaments and biological substances, initial encounter: Secondary | ICD-10-CM

## 2023-07-31 NOTE — ED Provider Notes (Signed)
[place] EMERGENCY DEPARTMENT AT [facility] Provider Note   CSN: [id number] Arrival date & time: 07/30/23  2230     History  Chief Complaint  Patient presents with   Shortness of Breath    Logan KUMPF is a 38 y.o. male.  The history is provided by the patient.  Shortness of Breath Severity:  Moderate Onset quality:  Sudden Timing:  Constant Progression:  Partially resolved Chronicity:  New Context comment:  Ingested an unknown quantity of THC gummies, unknown strength Relieved by:  Nothing Worsened by:  Nothing Ineffective treatments:  None tried Associated symptoms: no cough, no diaphoresis, no fever, no hemoptysis, no neck pain, no sputum production, no swollen glands, no vomiting and no wheezing   Risk factors: no prolonged immobilization and no recent surgery        Home Medications Prior to Admission medications   Medication Sig Start Date End Date Taking? Authorizing Provider  atorvastatin (LIPITOR) 20 MG tablet Take 1 tablet (20 mg total) by mouth daily. 04/01/23   Hoy Register, MD  Blood Glucose Monitoring Suppl (TRUE METRIX METER) w/Device KIT Testing 3 (three) times daily before meals. 03/31/23   Hoy Register, MD  clotrimazole (LOTRIMIN) 1 % cream Apply to affected area 3 times daily Patient not taking: Reported on 03/31/2023 11/29/22   Gwyneth Sprout, MD  diclofenac (VOLTAREN) 75 MG EC tablet Take 1 tablet (75 mg total) by mouth 2 (two) times daily. Patient not taking: Reported on 03/13/2021 12/05/20   Mardella Layman, MD  diclofenac Sodium (VOLTAREN) 1 % GEL Apply 2 g topically 4 (four) times daily. Patient not taking: Reported on 03/31/2023 03/13/21   Dietrich Pates, PA-C  glipiZIDE (GLUCOTROL) 5 MG tablet Take 1 tablet (5 mg total) by mouth 2 (two) times daily before a meal. 03/31/23   Hoy Register, MD  glucose blood (TRUE METRIX BLOOD GLUCOSE TEST) test strip Use 3 times daily 03/31/23   Hoy Register, MD  loratadine (CLARITIN) 10 MG tablet  Take 10 mg by mouth daily as needed for allergies. Patient not taking: Reported on 03/13/2021    [provider]  metFORMIN (GLUCOPHAGE-XR) 500 MG 24 hr tablet Take 1 tablet (500 mg total) by mouth 2 (two) times daily with a meal. 07/06/23   Newlin, Odette Horns, MD  predniSONE (STERAPRED UNI-PAK 21 TAB) 10 MG (21) TBPK tablet Take by mouth daily. Take 6 tabs by mouth daily  for 2 days, then 5 tabs for 2 days, then 4 tabs for 2 days, then 3 tabs for 2 days, 2 tabs for 2 days, then 1 tab by mouth daily for 2 days Patient not taking: Reported on 03/31/2023 03/13/21   Khatri, Hina, PA-C  Semaglutide, 1 MG/DOSE, 4 MG/3ML SOPN Inject 1 mg as directed once a week. 03/31/23   Hoy Register, MD  Semaglutide,0.25 or 0.5MG /DOS, (OZEMPIC, 0.25 OR 0.5 MG/DOSE,) 2 MG/3ML SOPN Inject 0.5 mg into the skin once a week. 07/06/23   Hoy Register, MD  TRUEplus Lancets 28G MISC Testing 3 (three) times daily before meals. 03/31/23   Hoy Register, MD      Allergies    Patient has no known allergies.    Review of Systems   Review of Systems  Constitutional:  Negative for diaphoresis and fever.  HENT:  Negative for facial swelling.   Respiratory:  Positive for shortness of breath. Negative for cough, hemoptysis, sputum production and wheezing.   Gastrointestinal:  Negative for vomiting.  Musculoskeletal:  Negative for neck pain.  All other systems reviewed and are negative.   Physical Exam Updated Vital Signs BP (!) 110/46   Pulse 94   Temp 98 F (36.7 C)   Resp 18   Ht 6\' 4"  (1.93 m)   Wt (!) 249.5 kg   SpO2 96%   BMI 66.95 kg/m  Physical Exam Vitals and nursing note reviewed.  Constitutional:      General: He is not in acute distress.    Appearance: He is well-developed. He is not diaphoretic.  HENT:     Head: Normocephalic and atraumatic.     Nose: Nose normal.  Eyes:     Conjunctiva/sclera: Conjunctivae normal.     Pupils: Pupils are equal, round, and reactive to light.  Cardiovascular:      Rate and Rhythm: Normal rate and regular rhythm.     Pulses: Normal pulses.     Heart sounds: Normal heart sounds.  Pulmonary:     Effort: Pulmonary effort is normal. No respiratory distress.     Breath sounds: Normal breath sounds. No wheezing or rales.  Abdominal:     General: Bowel sounds are normal.     Palpations: Abdomen is soft.     Tenderness: There is no abdominal tenderness. There is no guarding or rebound.  Musculoskeletal:        General: Normal range of motion.     Cervical back: Normal range of motion and neck supple.  Skin:    General: Skin is warm and dry.     Capillary Refill: Capillary refill takes less than 2 seconds.  Neurological:     General: No focal deficit present.     Mental Status: He is alert and oriented to person, place, and time.     Deep Tendon Reflexes: Reflexes normal.  Psychiatric:        Mood and Affect: Mood normal.        Behavior: Behavior normal.     ED Results / Procedures / Treatments   Labs (all labs ordered are listed, but only abnormal results are displayed) Results for orders placed or performed during the hospital encounter of 07/31/23  CBG monitoring, ED   Collection Time: 07/30/23 10:45 PM  Result Value Ref Range   Glucose-Capillary 203 (H) 70 - 99 mg/dL  Basic metabolic panel   Collection Time: 07/30/23 10:46 PM  Result Value Ref Range   Sodium 138 135 - 145 mmol/L   Potassium 3.8 3.5 - 5.1 mmol/L   Chloride 101 98 - 111 mmol/L   CO2 28 22 - 32 mmol/L   Glucose, Bld 203 (H) 70 - 99 mg/dL   BUN 14 6 - 20 mg/dL   Creatinine, Ser 3.24 0.61 - 1.24 mg/dL   Calcium 9.1 8.9 - 40.1 mg/dL   GFR, Estimated >02 >72 mL/min   Anion gap 9 5 - 15  CBC   Collection Time: 07/30/23 10:46 PM  Result Value Ref Range   WBC 10.6 (H) 4.0 - 10.5 K/uL   RBC 4.52 4.22 - 5.81 MIL/uL   Hemoglobin 13.4 13.0 - 17.0 g/dL   HCT 53.6 64.4 - 03.4 %   MCV 91.4 80.0 - 100.0 fL   MCH 29.6 26.0 - 34.0 pg   MCHC 32.4 30.0 - 36.0 g/dL   RDW 74.2  59.5 - 63.8 %   Platelets 297 150 - 400 K/uL   nRBC 0.0 0.0 - 0.2 %  Troponin I (High Sensitivity)   Collection Time: 07/30/23 10:46 PM  Result Value  Ref Range   Troponin I (High Sensitivity) 3 <18 ng/L   DG Chest 2 View Result Date: 07/30/2023 CLINICAL DATA:  Chest pain, dyspnea EXAM: CHEST - 2 VIEW COMPARISON:  07/25/2017 FINDINGS: The heart size and mediastinal contours are within normal limits. Both lungs are clear. The visualized skeletal structures are unremarkable. IMPRESSION: No active cardiopulmonary disease. Electronically Signed   By: Helyn Numbers M.D.   On: 07/30/2023 23:33    EKG EKG Interpretation Date/Time:  Friday July 30 2023 22:39:02 EST Ventricular Rate:  106 PR Interval:  216 QRS Duration:  84 QT Interval:  308 QTC Calculation: 409 R Axis:   40  Text Interpretation: Sinus tachycardia with 1st degree A-V block Confirmed by Nicanor Alcon, Cristofer Yaffe ([id number]) on 07/30/2023 11:50:05 PM  Radiology DG Chest 2 View Result Date: 07/30/2023 CLINICAL DATA:  Chest pain, dyspnea EXAM: CHEST - 2 VIEW COMPARISON:  07/25/2017 FINDINGS: The heart size and mediastinal contours are within normal limits. Both lungs are clear. The visualized skeletal structures are unremarkable. IMPRESSION: No active cardiopulmonary disease. Electronically Signed   By: Helyn Numbers M.D.   On: 07/30/2023 23:33    Procedures Procedures    Medications Ordered in ED Medications - No data to display  ED Course/ Medical Decision Making/ A&P                                 Medical Decision Making Patient ingested unknown amount of THC gummies and became SOB, it is now better 4 hours later   Amount and/or Complexity of Data Reviewed Independent Historian: spouse    Details: See above  External Data Reviewed: notes.    Details: Previous notes reviewed  Labs: ordered.    Details: Troponin normal 3, white count 10.6, normal hemoglobin 13.4, normal platelets.  Normal sodium 138, normal potassium 3.8,  normal creatinine  Radiology: ordered and independent interpretation performed.    Details: Normal CXR ECG/medicine tests: ordered and independent interpretation performed. Decision-making details documented in ED Course.  Risk Risk Details: Well appearing.  Appears to be under the influence of substances. This is adverse reation to a drug.  I have advised the patient to not use THC anymore.  Stable for discharge.      Final Clinical Impression(s) / ED Diagnoses Final diagnoses:  Adverse effect of drug, initial encounter   Return for intractable cough, coughing up blood, fevers > 100.4 unrelieved by medication, shortness of breath, intractable vomiting, chest pain, shortness of breath, weakness, numbness, changes in speech, facial asymmetry, abdominal pain, passing out, Inability to tolerate liquids or food, cough, altered mental status or any concerns. No signs of systemic illness or infection. The patient is nontoxic-appearing on exam and vital signs are within normal limits.  I have reviewed the triage vital signs and the nursing notes. Pertinent labs & imaging results that were available during my care of the patient were reviewed by me and considered in my medical decision making (see chart for details). After history, exam, and medical workup I feel the patient has been appropriately medically screened and is safe for discharge home. Pertinent diagnoses were discussed with the patient. Patient was given return precautions.  Rx / DC Orders ED Discharge Orders     None         Jamel Holzmann, MD 07/31/23 1914

## 2023-07-31 NOTE — ED Notes (Signed)
Pt reports having a Gatorade ,cookies 20 mins ago. Pt denies any N&V or abd pain or sob.

## 2023-08-03 ENCOUNTER — Encounter: Payer: Self-pay | Admitting: Pharmacist

## 2023-08-03 NOTE — Progress Notes (Unsigned)
 S:     No chief complaint on file.  38 y.o. male who presents for diabetes evaluation, education, and management in the context of the LIBERATE Study.   PMH is significant for T2DM.  Patient was referred and last seen by Primary Care Provider, Dr. Delbert, on 03/31/2023. Patient was added to the LIBERATE study on 05/04/23 with an A1c of 12.5. We last saw him on 07/06/2023. At that visit, Ozempic  was increased to 0.5 mg. Since his last visit, he was admitted to the ED on 07/31/2023 due to ingestion of an unknown quantity of THC gummies.  Today, patient arrives in good spirits and presents without any assistance.  Patient reports diabetes was diagnosed in May of this year. Was diagnosed in the ED. He last saw Dr. Newlin on 03/31/23 to establish care. Ozempic  was started at that visit. Metformin  dose was increased. Glipizide  added as well. Pt has no known hx of ASCVD, CKD, or CHF. No known personal or family hx of thyroid cancer or pancreatitis.   Since last vist, continuing to take Ozempic  0.5 mg weekly. Denies any NV, belly pain. Of note, taking metformin  regular release and endorses diarrhea. Reports adherence to glipizide . No other concerns for me today.   Family/Social History:  -Fhx: HTN -Tobacco: never smoker  -Alcohol: none reported   Current diabetes medications include: glipizide  5 mg BID, Ozempic  0.25 mg weekly, metformin  1000 mg BID  Patient reports adherence to taking all medications as prescribed.   Insurance coverage: none  Patient denies hypoglycemic events.  Patient denies nocturia (nighttime urination).  Patient denies neuropathy (nerve pain). Patient denies visual changes. Patient reports self foot exams.   Patient reported dietary habits:  -Holidays - dietary indiscretion   Patient-reported exercise habits: -Plays basketball   O:  Lab Results  Component Value Date   HGBA1C 12.5 (A) 05/04/2023    There were no vitals filed for this visit.   Lipid Panel      Component Value Date/Time   CHOL 238 (H) 03/31/2023 0955   TRIG 240 (H) 03/31/2023 0955   HDL 23 (L) 03/31/2023 0955   LDLCALC 169 (H) 03/31/2023 0955    Clinical Atherosclerotic Cardiovascular Disease (ASCVD): No  The ASCVD Risk score (Arnett DK, et al., 2019) failed to calculate for the following reasons:   The 2019 ASCVD risk score is only valid for ages 81 to 47   Patient is participating in a Managed Medicaid Plan: no   -collect A1c -sx hyperglycemia?? -liberate time within range  A/P:  LIBERATE Study:  -Patient provided verbal consent to participate in the study. Consent documented in electronic medical record.  -Provided education on Libre 3 CGM. Collaborated to ensure Herlene 3 app was downloaded on patient's phone. Educated on how to place sensor every 14 days, patient placed first sensor correctly and verbalized understanding of use, removal, and how to place next sensor. Discussed alarms. 8 sensors provided for a 3 month supply. Educated to contact the office if the sensor falls off early and replacements are needed before their next [employer].   Diabetes longstanding currently uncontrolled. A1c today is 12.5%, down from >15% 1 month ago. He is tolerating Ozempic  well. Also adherent to glipizide  and metformin . Patient is able to verbalize appropriate hypoglycemia management plan. -Increased Ozempic  to 0.5 mg weekly.  -Continue metformin  and glipizide  at current doses.  -Patient educated on purpose, proper use, and potential adverse effects of Ozempic .  -Extensively discussed pathophysiology of diabetes, recommended lifestyle interventions,  dietary effects on blood sugar control.  -Counseled on s/sx of and management of hypoglycemia.  -Next A1c anticipated 07/2023.   Written patient instructions provided. Patient verbalized understanding of treatment plan.  Total time in face to face counseling 30 minutes.    Follow-up:  Pharmacist in 1 month.  Margretta Fear, PharmD  Candidate [employer] Class of 2027  Herlene Fleeta Morris, PharmD, [place], CPP Clinical Pharmacist [facility] [phone]

## 2023-08-10 ENCOUNTER — Ambulatory Visit: Payer: Self-pay | Attending: Family Medicine | Admitting: Pharmacist

## 2023-08-13 ENCOUNTER — Encounter: Payer: Self-pay | Admitting: Pharmacist

## 2023-08-31 ENCOUNTER — Encounter: Payer: Self-pay | Admitting: Pharmacist

## 2023-09-02 ENCOUNTER — Encounter: Payer: Self-pay | Admitting: Pharmacist

## 2023-09-02 ENCOUNTER — Other Ambulatory Visit: Payer: Self-pay

## 2023-09-02 ENCOUNTER — Ambulatory Visit: Payer: Self-pay | Attending: Family Medicine | Admitting: Pharmacist

## 2023-09-02 DIAGNOSIS — Z7985 Long-term (current) use of injectable non-insulin antidiabetic drugs: Secondary | ICD-10-CM

## 2023-09-02 DIAGNOSIS — Z7984 Long term (current) use of oral hypoglycemic drugs: Secondary | ICD-10-CM

## 2023-09-02 DIAGNOSIS — E1169 Type 2 diabetes mellitus with other specified complication: Secondary | ICD-10-CM

## 2023-09-02 DIAGNOSIS — Z794 Long term (current) use of insulin: Secondary | ICD-10-CM

## 2023-09-02 LAB — POCT GLYCOSYLATED HEMOGLOBIN (HGB A1C): HbA1c, POC (controlled diabetic range): 7.7 % — AB (ref 0.0–7.0)

## 2023-09-02 MED ORDER — OZEMPIC (0.25 OR 0.5 MG/DOSE) 2 MG/3ML ~~LOC~~ SOPN
0.5000 mg | PEN_INJECTOR | SUBCUTANEOUS | 0 refills | Status: AC
  Filled 2023-09-02 – 2024-04-17 (×6): qty 3, 28d supply, fill #0

## 2023-09-02 MED ORDER — METFORMIN HCL ER 500 MG PO TB24
500.0000 mg | ORAL_TABLET | Freq: Every day | ORAL | 1 refills | Status: AC
  Filled 2023-09-02 – 2023-11-25 (×3): qty 90, 90d supply, fill #0
  Filled 2024-01-11 – 2024-04-17 (×3): qty 90, 90d supply, fill #1

## 2023-09-02 NOTE — Progress Notes (Signed)
 S:     No chief complaint on file.  38 y.o. male who presents for diabetes evaluation, education, and management in the context of the LIBERATE Study.  PMH is significant for T2DM, obesity.  Patient was referred and last seen by Primary Care Provider, Dr. Alvis Lemmings, on 03/31/2023.   Today, patient arrives in good spirits and presents without any assistance. I last saw him 07/06/2023.  Since last visit, he completed 4 weeks of Ozempic 0.5 mg but then did not fill the 1 mg dose. Denies any NV, belly pain while taking. Of note, taking metformin XR and endorses continued diarrhea. Reports adherence to glipizide. No other concerns for me today.   Family/Social History:  -Fhx: HTN -Tobacco: never smoker  -Alcohol: none reported   Current diabetes medications include: glipizide 5 mg BID, metformin 500 mg XR BID, Ozempic 0.5mg  Patient admits to suboptimal adherence to taking all medications as prescribed.   Insurance coverage: self pay  Patient denies hypoglycemic events.  CGM Study Study visit: 3 Month Follow Up Visit  CGM Data Download date: 09/02/2023, 90-day % Time CGM Is Active: 65 % Average glucose (mg/dL): 409 mg/dL Glucose Management Indicator (%): 7.6 % Glucose Variability (%): 20.2 % Time Above Range >180 mg/dL (%): 43 % Time in Range 70-180 mg/dL (%): 57 % Time Below Range <70 mg/dL (%): 0 %  Diabetes Distress Scale Feeling like diabetes is taking up too much of my mental and physical energy every day.: A slight problem Feeling that my doctor doesn't know enough about diabetes and diabetic care. : Not a problem Feeling angry, scared, and/or depressed when I think about living with diabetes : Not a problem Feeling that my doctor doesn't give my clear directions on how to manage my diabetes. : Not a problem Feeling that im not testing my blood sugars frequently enough.: A moderate problem Feeling that I'm often failing with my diabetes routine: A slight problem Feelling  that friends and family are not supportive enough of self care efforts.: Not a problem Feeling that diabetes controls my life.: Not a problem Feeling that my doctor doesn't take my concerns seriously enough: Not a problem Not feeling confident in my day to day ability to manage diabetes: Not a problem Feeling that I will end up with serious long term complications no matter what I do.: A slight problem Feeling that I am not sticking closely enough to a good meal plan.: A slight problem Feeling that friends or family don't appreciate how difficult living with diabetes can be. : Not a problem Feeling overwhelmed by the demands of living with diabetes.: Not a problem Feeling that I don't have a doctor who I can see.: Not a problem Not feeling motivated to keep up my diabetes self management.: Not a problem Feeling that friends or family don't give me the emotional support that I would like. : Not a problem DDS17 Score: 23 Emotional Burden Score: 1.4 Physician related distress score: 1 Regimen Related Distress score : 1.8 Interpersonal distress score: 1   Patient denies nocturia (nighttime urination).  Patient denies neuropathy (nerve pain). Patient denies visual changes. Patient reports self foot exams.   Patient reported dietary habits:  -Admits to dietary indiscretion recently that also lines up with suboptimal medication adherence over the last month or so  Patient-reported exercise habits: none   O:  Date of Download: 09/02/2023 for a 90-day report % Time CGM is active: 65% Average Glucose: 181 mg/dL Glucose Management Indicator: 7.6  Glucose Variability: 20.2 (goal <36%) Time in Goal:  - Time in range 70-180: 57% - Time above range: 43% - Time below range: 0% Observed patterns:  Lab Results  Component Value Date   HGBA1C 7.7 (A) 09/02/2023   There were no vitals filed for this visit.  Lipid Panel     Component Value Date/Time   CHOL 238 (H) 03/31/2023 0955   TRIG  240 (H) 03/31/2023 0955   HDL 23 (L) 03/31/2023 0955   LDLCALC 169 (H) 03/31/2023 0955    Clinical Atherosclerotic Cardiovascular Disease (ASCVD): No  The ASCVD Risk score (Arnett DK, et al., 2019) failed to calculate for the following reasons:   The 2019 ASCVD risk score is only valid for ages 79 to 36   Patient is participating in a Managed Medicaid Plan:  Yes     A/P:  LIBERATE Study:  - 8 sensors provided for a 3 month supply. Educated to contact the office if the sensor falls off early and replacements are needed before their next [employer].   Diabetes longstanding currently above goal but drastically improved since November. Commended him for this. Patient is not symptomatic at this time and is able to verbalize appropriate hypoglycemia management plan. Medication adherence appears to be suboptimal which makes his improved glycemic control even more impressive. He is amenable to getting refills today. I will have to restart Ozempic at the 0.5 mg weekly dose. Metformin dose decreased to 500 mg XR daily due to diarrhea on the BID dose. We will continue glipizide for now. I hope to stop this in the future with improved adherence and by utilizing the higher doses of Ozempic. -Continued glipizide 5 mg BID for now.  -Decrease metformin XR to 500 mg once a day. -Restart Ozempic 0.5 mg weekly x4 weeks. Then, increase to 1mg  weekly thereafter.  -Patient educated on purpose, proper use, and potential adverse effects of Ozempic, glipizide, and metformin.  -Extensively discussed pathophysiology of diabetes, recommended lifestyle interventions, dietary effects on blood sugar control.  -Counseled on s/sx of and management of hypoglycemia.  -Next A1c anticipated 10/2023 for LIBERATE #3.   Written patient instructions provided. Patient verbalized understanding of treatment plan.  Total time in face to face counseling 30 minutes.    Follow-up:  Pharmacist in 1 month.  Butch Penny,  PharmD, [place], CPP Clinical Pharmacist [facility] [phone] .

## 2023-09-14 ENCOUNTER — Other Ambulatory Visit: Payer: Self-pay

## 2023-10-01 ENCOUNTER — Ambulatory Visit: Payer: Self-pay | Admitting: Pharmacist

## 2023-10-22 ENCOUNTER — Other Ambulatory Visit: Payer: Self-pay

## 2023-10-25 ENCOUNTER — Other Ambulatory Visit: Payer: Self-pay

## 2023-10-28 ENCOUNTER — Ambulatory Visit: Payer: Self-pay | Admitting: Pharmacist

## 2023-11-05 ENCOUNTER — Encounter: Payer: Self-pay | Admitting: Pharmacist

## 2023-11-14 ENCOUNTER — Other Ambulatory Visit: Payer: Self-pay

## 2023-11-14 DIAGNOSIS — M545 Low back pain, unspecified: Secondary | ICD-10-CM | POA: Insufficient documentation

## 2023-11-14 NOTE — ED Triage Notes (Signed)
 Pt POV reporting lower back pain after rolling dice. Taking ibuprofen  and muscle relaxer without improvement.

## 2023-11-15 ENCOUNTER — Emergency Department (HOSPITAL_BASED_OUTPATIENT_CLINIC_OR_DEPARTMENT_OTHER)
Admission: EM | Admit: 2023-11-15 | Discharge: 2023-11-15 | Disposition: A | Discharge status: Home / Self Care | Payer: Self-pay | Attending: Emergency Medicine | Admitting: Emergency Medicine

## 2023-11-15 ENCOUNTER — Encounter (HOSPITAL_BASED_OUTPATIENT_CLINIC_OR_DEPARTMENT_OTHER): Payer: Self-pay

## 2023-11-15 DIAGNOSIS — M545 Low back pain, unspecified: Secondary | ICD-10-CM

## 2023-11-15 MED ORDER — OXYCODONE HCL 5 MG PO TABS
10.0000 mg | ORAL_TABLET | Freq: Once | ORAL | Status: AC
  Administered 2023-11-15: 10 mg via ORAL
  Filled 2023-11-15: qty 2

## 2023-11-15 MED ORDER — CELECOXIB 200 MG PO CAPS: 200.0000 mg | ORAL_CAPSULE | Freq: Two times a day (BID) | ORAL | 0 refills | Status: AC

## 2023-11-15 MED ORDER — ACETAMINOPHEN 500 MG PO TABS: 1000.0000 mg | ORAL_TABLET | Freq: Three times a day (TID) | ORAL | 0 refills | Status: AC | PRN

## 2023-11-15 MED ORDER — OXYCODONE HCL 5 MG PO TABS
5.0000 mg | ORAL_TABLET | Freq: Four times a day (QID) | ORAL | 0 refills | Status: AC | PRN
Start: 2023-11-15 — End: ?

## 2023-11-15 NOTE — ED Notes (Addendum)
 Pt ambulatory to rm 14. Placed on pulse ox and BP cuff.Pt c/o lower back pain worsens with movement. Denies numbness/tingling to lower extremities, loss bowel and bladder function. Awaiting orders .

## 2023-11-15 NOTE — ED Provider Notes (Signed)
 [place] EMERGENCY DEPARTMENT AT [facility] Provider Note   CSN: [id number] Arrival date & time: 11/14/23  2157     History Chief Complaint  Patient presents with   Back Pain    HPI Logan Patton is a 38 y.o. male presenting for low back pain. States that he felt it start while he was bending over playing a dice game Denies fevers chills nausea vomiting Hx of similar.  Has been taking advil  and muscle relaxer Patient's recorded medical, surgical, social, medication list and allergies were reviewed in the Snapshot window as part of the initial history.   Review of Systems   Review of Systems  Constitutional:  Negative for chills and fever.  HENT:  Negative for ear pain and sore throat.   Eyes:  Negative for pain and visual disturbance.  Respiratory:  Negative for cough and shortness of breath.   Cardiovascular:  Negative for chest pain and palpitations.  Gastrointestinal:  Negative for abdominal pain and vomiting.  Genitourinary:  Negative for dysuria and hematuria.  Musculoskeletal:  Positive for back pain. Negative for arthralgias.  Skin:  Negative for color change and rash.  Neurological:  Negative for seizures and syncope.  All other systems reviewed and are negative.   Physical Exam Updated Vital Signs BP 126/84 (BP Location: Right Arm)   Pulse 84   Temp 98.3 F (36.8 C) (Oral)   Resp 18   Ht 6\' 4"  (1.93 m)   Wt (!) 249.5 kg   SpO2 100%   BMI 66.95 kg/m  Physical Exam Vitals and nursing note reviewed.  Constitutional:      General: He is not in acute distress.    Appearance: He is well-developed. He is obese.  HENT:     Head: Normocephalic and atraumatic.  Eyes:     Conjunctiva/sclera: Conjunctivae normal.  Cardiovascular:     Rate and Rhythm: Normal rate and regular rhythm.     Heart sounds: No murmur heard. Pulmonary:     Effort: Pulmonary effort is normal. No respiratory distress.     Breath sounds: Normal breath sounds.  Abdominal:      Palpations: Abdomen is soft.     Tenderness: There is no abdominal tenderness.  Musculoskeletal:        General: No swelling.     Cervical back: Neck supple.  Skin:    General: Skin is warm and dry.     Capillary Refill: Capillary refill takes less than 2 seconds.  Neurological:     Mental Status: He is alert.  Psychiatric:        Mood and Affect: Mood normal.      ED Course/ Medical Decision Making/ A&P    Procedures Procedures   Medications Ordered in ED Medications  oxyCODONE  (Oxy IR/ROXICODONE ) immediate release tablet 10 mg (10 mg Oral Given 11/15/23 0235)   Medical Decision Making:   BROADY Patton is a 38 y.o. male who presented to the ED today with acute lower back pain over the past 72 hours, detailed above.    Patient placed on continuous vitals and telemetry monitoring while in ED which was reviewed periodically.   On my initial exam, the pt was with an intact neurologic exam, tolerating ambulation with an antalgic gait and p.o. intake without difficulty.  Patient had no abnormal DTRs, no midline spinal tenderness.  Patient endorsing complete sensation of the perineum.  Patient without episodes of fecal or urinary incontinence.  Patient has no focal neurologic deficits and  reassuring vital signs at this time.  No obvious physical abnormality or injury on exam. Notably, patient denies recent trauma, is afebrile, and denies IVDU.   Reviewed and confirmed nursing documentation for past medical history, family history, social history.    Initial Assessment:   With the patient's presentation of acute back pain in the above setting, most likely diagnosis is musculoskeletal strain. Other diagnoses were considered including (but not limited to) underlying fracture, epidural hematoma, cauda equina syndrome, spinal stenosis, spinal malignancy. These are considered less likely due to history of present illness and physical exam findings.   In particular, lack of fever,  substantial history of IV drug use, or substantial neurologic abnormality is less consistent with epidural abscess versus discitis or other spinal infection. In particular,  Initial Plan:  Multimodal pain control described and patient informed on safe usage.  Patient unable to get any imaging at this facility due to their body habitus.  Shared medical decision making with patient.  They could pursue to tertiary care facility for imaging but patient states that they would rather go home with supportive management and follow-up with the spine center in the outpatient setting. Patient stable for continued outpatient evaluation and management of their musculoskeletal pains.  Patient referred back to primary care provider for continued evaluation and management.   Initial Study Results:   Radiology  No orders to display     Disposition:   Based on the above findings, I believe patient is stable for discharge.    Patient and family educated about specific return precautions for given chief complaint and symptoms.  Patient and family educated about follow-up with PCP and .  Patient and family expressed understanding of return precautions and need for follow-up. Patient spoken to regarding all imaging and laboratory results and appropriate follow up for these results. All education provided in verbal and written form and time was allowed for answering of patient questions. Patient discharged.          Emergency Department Medication Summary:   Medications  oxyCODONE  (Oxy IR/ROXICODONE ) immediate release tablet 10 mg (10 mg Oral Given 11/15/23 0235)     Clinical Impression:  1. Acute low back pain without sciatica, unspecified back pain laterality      Discharge   Final Clinical Impression(s) / ED Diagnoses Final diagnoses:  Acute low back pain without sciatica, unspecified back pain laterality    Rx / DC Orders ED Discharge Orders          Ordered    oxyCODONE  (ROXICODONE ) 5 MG  immediate release tablet  Every 6 hours PRN        11/15/23 0306    celecoxib  (CELEBREX ) 200 MG capsule  2 times daily        11/15/23 0319    acetaminophen  (TYLENOL ) 500 MG tablet  Every 8 hours PRN        11/15/23 0319              Onetha Bile, MD 11/15/23 [phone]

## 2023-11-23 ENCOUNTER — Ambulatory Visit (HOSPITAL_COMMUNITY)
Admission: EM | Admit: 2023-11-23 | Discharge: 2023-11-23 | Disposition: A | Discharge status: Home / Self Care | Payer: Self-pay | Attending: Internal Medicine | Admitting: Internal Medicine

## 2023-11-23 ENCOUNTER — Ambulatory Visit (HOSPITAL_COMMUNITY): Payer: Self-pay

## 2023-11-23 ENCOUNTER — Encounter (HOSPITAL_COMMUNITY): Payer: Self-pay

## 2023-11-23 DIAGNOSIS — Z113 Encounter for screening for infections with a predominantly sexual mode of transmission: Secondary | ICD-10-CM | POA: Insufficient documentation

## 2023-11-23 DIAGNOSIS — M545 Low back pain, unspecified: Secondary | ICD-10-CM | POA: Insufficient documentation

## 2023-11-23 DIAGNOSIS — L089 Local infection of the skin and subcutaneous tissue, unspecified: Secondary | ICD-10-CM | POA: Insufficient documentation

## 2023-11-23 DIAGNOSIS — M5442 Lumbago with sciatica, left side: Secondary | ICD-10-CM | POA: Insufficient documentation

## 2023-11-23 MED ORDER — KETOROLAC TROMETHAMINE 30 MG/ML IJ SOLN
30.0000 mg | Freq: Once | INTRAMUSCULAR | Status: AC
  Administered 2023-11-23: 30 mg via INTRAMUSCULAR

## 2023-11-23 MED ORDER — KETOROLAC TROMETHAMINE 30 MG/ML IJ SOLN
INTRAMUSCULAR | Status: AC
Start: 2023-11-23 — End: ?
  Filled 2023-11-23: qty 1

## 2023-11-23 MED ORDER — PREDNISONE 20 MG PO TABS: 40.0000 mg | ORAL_TABLET | Freq: Every day | ORAL | 0 refills | Status: AC

## 2023-11-23 MED ORDER — CYCLOBENZAPRINE HCL 10 MG PO TABS: 10.0000 mg | ORAL_TABLET | Freq: Two times a day (BID) | ORAL | 0 refills | Status: AC | PRN

## 2023-11-23 MED ORDER — DOXYCYCLINE HYCLATE 100 MG PO CAPS
100.0000 mg | ORAL_CAPSULE | Freq: Two times a day (BID) | ORAL | 0 refills | Status: AC
Start: 2023-11-23 — End: ?

## 2023-11-23 NOTE — Discharge Instructions (Addendum)
 Back pain with radiating pain to the left leg: We did attempt to do an x-ray today but was unable to get any usable images.  Reassuringly, physical exam findings and symptoms are most consistent with a muscle strain or possibly sciatica on the left.  If the back pain continues the next step would be to follow-up with a spine specialist/orthopedist.  We will treat with the following: Toradol injection given today. This is a medication to help with pain. This is not a narcotic.   Prednisone  40 mg (2 tablets) once daily for 5 days. Take this in the morning.  This is a steroid to help with inflammation and pain.  While taking this medication you need to monitor your blood sugars extremely closely and if they are trending upwards then discontinue.  Monitor your intake of carbohydrates and sugars. Flexeril  10 mg every 8 hours as needed for muscle spasms.  Use caution as this medication can cause drowsiness.  If symptoms persist then we will need to schedule an appointment to see a spine specialist/orthopedist.  Skin infections: There are 3 areas of induration(skin infections) on the right side.  These may be contributing to the elevated blood sugars.  We will treat with the following: Doxycycline  100 mg twice daily for 10 days. Take this with food.   If areas do not resolve then return to urgent care for further evaluation  Screen for STD: Screening swab done today and results will be available in 24-48 hours. We will contact you if we need to arrange additional treatment based on your testing. Negative results will be on your MyChart account.

## 2023-11-23 NOTE — ED Provider Notes (Signed)
 [facility]    CSN: [id number] Arrival date & time: 11/23/23  1016      History   Chief Complaint Chief Complaint  Patient presents with   Back Pain   SEXUALLY TRANSMITTED DISEASE    HPI Logan Patton is a 38 y.o. male.   38 year old male who presents urgent care with several complaints including 3 weeks of back pain, skin infection for 1 month and needing screening for STD.  1.  Back pain: The patient reports that this started about 3 to 4 weeks ago.  He reports he was bending over playing dice when it started hurting.  He did not have a specific injury to the area.  He denies any injury in the past or history of back pain.  He did go to the emergency room on the 19th and was given a muscle relaxer and pain medication.  He reports that this has not helped.  The pain is on both sides of his lower back but only radiates down the left leg.  He denies any bowel or bladder incontinence.  He is not having any weakness in the lower extremities although it does hurt when he walks.  He denies any hematuria, dysuria, fevers, chills, nausea, vomiting, diarrhea.  He does report that sitting straight up hurts worse as does twisting.  2.  Skin infection: The patient reports that for the last month he has had 3 areas of tenderness, the right buttocks, right hip and the right lateral leg.  The areas have gotten more tender especially the one on the buttocks.  The areas have not been draining.  He denies any history of this.  He did denies any fevers or chills but thinks he might have had a low-grade fever 1 day.  His blood sugars have been more elevated since these areas started.  He has not treated them with any medication over-the-counter or prescription.   3.  Screening for STI: The patient request screening swab for STIs.  He denies any exposure.  He denies any discharge, dysuria, hematuria.  He declines blood work today.    Back Pain   Past Medical History:  Diagnosis Date   GERD  (gastroesophageal reflux disease)    Type 2 diabetes mellitus (HCC) 03/31/2023    Patient Active Problem List   Diagnosis Date Noted   Type 2 diabetes mellitus (HCC) 03/31/2023    History reviewed. No pertinent surgical history.     Home Medications    Prior to Admission medications   Medication Sig Start Date End Date Taking? Authorizing Provider  cyclobenzaprine  (FLEXERIL ) 10 MG tablet Take 1 tablet (10 mg total) by mouth 2 (two) times daily as needed for muscle spasms. 11/23/23  Yes Alverda Nazzaro A, PA-C  doxycycline  (VIBRAMYCIN ) 100 MG capsule Take 1 capsule (100 mg total) by mouth 2 (two) times daily. 11/23/23  Yes Lizabeth Fellner A, PA-C  predniSONE  (DELTASONE ) 20 MG tablet Take 2 tablets (40 mg total) by mouth daily with breakfast for 5 days. 11/23/23 11/28/23 Yes Simara Rhyner A, PA-C  acetaminophen  (TYLENOL ) 500 MG tablet Take 2 tablets (1,000 mg total) by mouth every 8 (eight) hours as needed for mild pain (pain score 1-3) or moderate pain (pain score 4-6). 11/15/23   Onetha Bile, MD  atorvastatin  (LIPITOR) 20 MG tablet Take 1 tablet (20 mg total) by mouth daily. 04/01/23   Newlin, Enobong, MD  Blood Glucose Monitoring Suppl (TRUE METRIX METER) w/Device KIT Testing 3 (three) times daily before  meals. 03/31/23   Newlin, Enobong, MD  celecoxib  (CELEBREX ) 200 MG capsule Take 1 capsule (200 mg total) by mouth 2 (two) times daily. 11/15/23   Onetha Bile, MD  clotrimazole  (LOTRIMIN ) 1 % cream Apply to affected area 3 times daily Patient not taking: Reported on 03/31/2023 11/29/22   Almond Army, MD  diclofenac  (VOLTAREN ) 75 MG EC tablet Take 1 tablet (75 mg total) by mouth 2 (two) times daily. Patient not taking: Reported on 03/13/2021 12/05/20   Afton Albright, MD  diclofenac  Sodium (VOLTAREN ) 1 % GEL Apply 2 g topically 4 (four) times daily. Patient not taking: Reported on 03/31/2023 03/13/21   Khatri, Hina, PA-C  glipiZIDE  (GLUCOTROL ) 5 MG tablet Take 1 tablet (5 mg total)  by mouth 2 (two) times daily before a meal. 03/31/23   Joaquin Mulberry, MD  glucose blood (TRUE METRIX BLOOD GLUCOSE TEST) test strip Use 3 times daily 03/31/23   Newlin, Enobong, MD  loratadine (CLARITIN) 10 MG tablet Take 10 mg by mouth daily as needed for allergies. Patient not taking: Reported on 03/13/2021    [provider]  metFORMIN  (GLUCOPHAGE -XR) 500 MG 24 hr tablet Take 1 tablet (500 mg total) by mouth daily. 09/02/23   Newlin, Enobong, MD  oxyCODONE  (ROXICODONE ) 5 MG immediate release tablet Take 1 tablet (5 mg total) by mouth every 6 (six) hours as needed for severe pain (pain score 7-10). 11/15/23   Onetha Bile, MD  Semaglutide , 1 MG/DOSE, 4 MG/3ML SOPN Inject 1 mg as directed once a week. 03/31/23   Newlin, Enobong, MD  Semaglutide ,0.25 or 0.5MG /DOS, (OZEMPIC , 0.25 OR 0.5 MG/DOSE,) 2 MG/3ML SOPN Inject 0.5 mg into the skin once a week. 09/02/23   Newlin, Enobong, MD  TRUEplus Lancets 28G MISC Testing 3 (three) times daily before meals. 03/31/23   Joaquin Mulberry, MD    Family History Family History  Problem Relation Age of Onset   Hypertension Mother     Social History Social History   Tobacco Use   Smoking status: Never   Smokeless tobacco: Never  Vaping Use   Vaping status: Never Used  Substance Use Topics   Alcohol use: Yes    Comment: occasionally   Drug use: Not Currently    Types: Marijuana     Allergies   Patient has no known allergies.   Review of Systems Review of Systems  Musculoskeletal:  Positive for back pain.     Physical Exam Triage Vital Signs ED Triage Vitals  Encounter Vitals Group     BP 11/23/23 1135 (!) 157/88     Systolic BP Percentile --      Diastolic BP Percentile --      Pulse Rate 11/23/23 1135 99     Resp 11/23/23 1135 18     Temp 11/23/23 1135 97.8 F (36.6 C)     Temp Source 11/23/23 1135 Oral     SpO2 11/23/23 1135 99 %     Weight --      Height --      Head Circumference --      Peak Flow --      Pain  Score 11/23/23 1136 10     Pain Loc --      Pain Education --      Exclude from Growth Chart --    No data found.  Updated Vital Signs BP (!) 157/88 (BP Location: Right Arm)   Pulse 99   Temp 97.8 F (36.6 C) (Oral)   Resp 18  SpO2 99%   Visual Acuity Right Eye Distance:   Left Eye Distance:   Bilateral Distance:    Right Eye Near:   Left Eye Near:    Bilateral Near:     Physical Exam Vitals and nursing note reviewed.  Constitutional:      General: He is not in acute distress.    Appearance: He is well-developed. He is obese.  HENT:     Head: Normocephalic and atraumatic.     Right Ear: Tympanic membrane normal.     Left Ear: Tympanic membrane normal.     Nose: Nose normal.     Mouth/Throat:     Mouth: Mucous membranes are moist.  Eyes:     Conjunctiva/sclera: Conjunctivae normal.  Cardiovascular:     Rate and Rhythm: Normal rate and regular rhythm.     Heart sounds: No murmur heard. Pulmonary:     Effort: Pulmonary effort is normal. No respiratory distress.     Breath sounds: Normal breath sounds.  Abdominal:     Palpations: Abdomen is soft.     Tenderness: There is no abdominal tenderness.  Musculoskeletal:        General: No swelling.     Cervical back: Neck supple.     Lumbar back: Spasms, tenderness and bony tenderness present. No deformity or signs of trauma. Decreased range of motion.  Skin:    General: Skin is warm and dry.     Capillary Refill: Capillary refill takes less than 2 seconds.     Comments: Indurated areas on the right buttocks, right hip and right lateral thigh consistent with developing abscesses  Neurological:     General: No focal deficit present.     Mental Status: He is alert.  Psychiatric:        Mood and Affect: Mood normal.      UC Treatments / Results  Labs (all labs ordered are listed, but only abnormal results are displayed) Labs Reviewed  CYTOLOGY, (ORAL, ANAL, URETHRAL) ANCILLARY ONLY    EKG   Radiology No  results found.  Procedures Procedures (including critical care time)  Medications Ordered in UC Medications  ketorolac (TORADOL) 30 MG/ML injection 30 mg (has no administration in time range)    Initial Impression / Assessment and Plan / UC Course  I have reviewed the triage vital signs and the nursing notes.  Pertinent labs & imaging results that were available during my care of the patient were reviewed by me and considered in my medical decision making (see chart for details).     Lumbar back pain - Plan: DG Lumbar Spine Complete, DG Lumbar Spine Complete  Acute bilateral low back pain with left-sided sciatica  Screening examination for STI  Skin infection   Back pain with radiating pain to the left leg: We did attempt to do an x-ray today but was unable to get any usable images due to the patient's body habitus.  Reassuringly, physical exam findings and symptoms are most consistent with a muscle strain or possibly sciatica on the left.  If the back pain continues the next step would be to follow-up with a spine specialist/orthopedist.  We will treat with the following: Toradol injection given today. Last Cr 0.8 06/2023. Prednisone  40 mg (2 tablets) once daily for 5 days. Take this in the morning.  This is a steroid to help with inflammation and pain.  Long discussion with the patient about glucose levels and steroids. While taking this medication he will need to  monitor his blood sugars extremely closely and if they are trending upwards then discontinue. He does have a continuous monitor and is able to see his blood sugars on his phone.  Advised him to monitor his intake of carbohydrates and sugars.  He is going to scheduled to see his doctor that manages his blood sugars as soon as possible. Flexeril  10 mg every 8 hours as needed for muscle spasms.  Use caution as this medication can cause drowsiness.  If symptoms persist then we will need to schedule an appointment to see a spine  specialist/orthopedist.  Skin infections: There are 3 areas of induration(skin infections) on the right side.  These may be contributing to the elevated blood sugars.  We will treat with the following: Doxycycline  100 mg twice daily for 10 days. Take this with food.   If areas do not resolve then return to urgent care for further evaluation  Screen for STD: Screening swab done today and results will be available in 24-48 hours. We will contact you if we need to arrange additional treatment based on your testing. Negative results will be on your MyChart account.              Final Clinical Impressions(s) / UC Diagnoses   Final diagnoses:  Lumbar back pain  Acute bilateral low back pain with left-sided sciatica  Screening examination for STI  Skin infection     Discharge Instructions      Back pain with radiating pain to the left leg: We did attempt to do an x-ray today but was unable to get any usable images.  Reassuringly, physical exam findings and symptoms are most consistent with a muscle strain or possibly sciatica on the left.  If the back pain continues the next step would be to follow-up with a spine specialist/orthopedist.  We will treat with the following: Toradol injection given today. This is a medication to help with pain. This is not a narcotic.   Prednisone  40 mg (2 tablets) once daily for 5 days. Take this in the morning.  This is a steroid to help with inflammation and pain.  While taking this medication you need to monitor your blood sugars extremely closely and if they are trending upwards then discontinue.  Monitor your intake of carbohydrates and sugars. Flexeril  10 mg every 8 hours as needed for muscle spasms.  Use caution as this medication can cause drowsiness.  If symptoms persist then we will need to schedule an appointment to see a spine specialist/orthopedist.  Skin infections: There are 3 areas of induration(skin infections) on the right side.  These may be  contributing to the elevated blood sugars.  We will treat with the following: Doxycycline  100 mg twice daily for 10 days. Take this with food.   If areas do not resolve then return to urgent care for further evaluation  Screen for STD: Screening swab done today and results will be available in 24-48 hours. We will contact you if we need to arrange additional treatment based on your testing. Negative results will be on your MyChart account.               ED Prescriptions     Medication Sig Dispense Auth. Provider   predniSONE  (DELTASONE ) 20 MG tablet Take 2 tablets (40 mg total) by mouth daily with breakfast for 5 days. 10 tablet Lorenzo Romberg A, PA-C   cyclobenzaprine  (FLEXERIL ) 10 MG tablet Take 1 tablet (10 mg total) by mouth 2 (two) times  daily as needed for muscle spasms. 20 tablet Sage Kopera A, PA-C   doxycycline  (VIBRAMYCIN ) 100 MG capsule Take 1 capsule (100 mg total) by mouth 2 (two) times daily. 20 capsule Kreg Pesa, [place]      PDMP not reviewed this encounter.   Kreg Pesa, PA-C 11/23/23 1406

## 2023-11-23 NOTE — ED Triage Notes (Addendum)
 Pt c/o lt lower back pain radiating down leg for 3 wks. States seen and tx'd in ED with no relief. Pt c/o multiple abscesses to rt lowe side for 2 wks. Unknown if drainage. Pt requesting routine STD check. Denies sx's.

## 2023-11-24 LAB — CYTOLOGY, (ORAL, ANAL, URETHRAL) ANCILLARY ONLY
Chlamydia: NEGATIVE
Comment: NEGATIVE
Comment: NEGATIVE
Comment: NORMAL
Neisseria Gonorrhea: NEGATIVE
Trichomonas: NEGATIVE

## 2023-11-25 ENCOUNTER — Other Ambulatory Visit: Payer: Self-pay

## 2024-01-04 ENCOUNTER — Other Ambulatory Visit: Payer: Self-pay

## 2024-01-10 ENCOUNTER — Telehealth: Payer: Self-pay | Admitting: Family Medicine

## 2024-01-10 NOTE — Telephone Encounter (Signed)
 Called pt to confirm appt.Pt did not answer and could not LVM.

## 2024-01-11 ENCOUNTER — Ambulatory Visit: Payer: Self-pay | Attending: Family Medicine | Admitting: Family Medicine

## 2024-01-11 ENCOUNTER — Other Ambulatory Visit (HOSPITAL_COMMUNITY)
Admission: RE | Admit: 2024-01-11 | Discharge: 2024-01-11 | Disposition: A | Discharge status: Home / Self Care | Payer: Self-pay | Source: Ambulatory Visit | Attending: Family Medicine | Admitting: Family Medicine

## 2024-01-11 ENCOUNTER — Encounter: Payer: Self-pay | Admitting: Family Medicine

## 2024-01-11 ENCOUNTER — Other Ambulatory Visit: Payer: Self-pay

## 2024-01-11 VITALS — BP 144/91 | HR 76 | Wt >= 6400 oz

## 2024-01-11 DIAGNOSIS — Z113 Encounter for screening for infections with a predominantly sexual mode of transmission: Secondary | ICD-10-CM

## 2024-01-11 DIAGNOSIS — Z6841 Body Mass Index (BMI) 40.0 and over, adult: Secondary | ICD-10-CM

## 2024-01-11 DIAGNOSIS — Z7984 Long term (current) use of oral hypoglycemic drugs: Secondary | ICD-10-CM

## 2024-01-11 DIAGNOSIS — E1169 Type 2 diabetes mellitus with other specified complication: Secondary | ICD-10-CM

## 2024-01-11 DIAGNOSIS — Z794 Long term (current) use of insulin: Secondary | ICD-10-CM

## 2024-01-11 DIAGNOSIS — R030 Elevated blood-pressure reading, without diagnosis of hypertension: Secondary | ICD-10-CM

## 2024-01-11 DIAGNOSIS — E785 Hyperlipidemia, unspecified: Secondary | ICD-10-CM

## 2024-01-11 LAB — POCT GLYCOSYLATED HEMOGLOBIN (HGB A1C): HbA1c, POC (controlled diabetic range): 13.1 % — AB (ref 0.0–7.0)

## 2024-01-11 LAB — GLUCOSE, POCT (MANUAL RESULT ENTRY): POC Glucose: 412 mg/dL — AB (ref 70–99)

## 2024-01-11 MED ORDER — ATORVASTATIN CALCIUM 20 MG PO TABS
20.0000 mg | ORAL_TABLET | Freq: Every day | ORAL | 1 refills | Status: AC
  Filled 2024-01-11: qty 90, 90d supply, fill #0
  Filled 2024-04-17: qty 90, 90d supply, fill #1

## 2024-01-11 MED ORDER — GLIPIZIDE 10 MG PO TABS
10.0000 mg | ORAL_TABLET | Freq: Two times a day (BID) | ORAL | 1 refills | Status: AC
  Filled 2024-01-11: qty 180, 90d supply, fill #0
  Filled 2024-04-17: qty 180, 90d supply, fill #1

## 2024-01-11 NOTE — Patient Instructions (Signed)
 VISIT SUMMARY:  Today, you were seen for elevated blood sugar levels and requested STD testing. Your blood sugar levels have been consistently high, and your last A1c was significantly elevated. We discussed your current medications, dietary habits, and the importance of glucose monitoring. You also requested STD testing but declined HIV and syphilis tests.  YOUR PLAN:  -TYPE 2 DIABETES MELLITUS: Type 2 Diabetes Mellitus is a condition where your body does not use insulin  properly, leading to high blood sugar levels. Your blood sugar is currently poorly controlled, and your HbA1c has increased to 13.1%. We will start you on Ozempic , which should help with weight loss and glucose control. You will begin with 0.25 mg weekly for 4 weeks, then increase to 0.5 mg, and eventually to 1 mg as tolerated. Your glipizide  dose will be increased to 10 mg twice daily, and you will continue taking metformin  500 mg daily. Please refill your atorvastatin  and try to switch to diet drinks to reduce your sugar intake. Check with pharmacist Herlene about your glucose sensor refills.  -HYPERTENSION: Hypertension is high blood pressure, which can lead to serious health issues if not managed. Your blood pressure was slightly elevated today. We will recheck it before you leave the clinic.  -SEXUALLY TRANSMITTED DISEASE SCREENING: You requested STD testing and declined HIV and syphilis tests. We will order a urine test for STD screening.  INSTRUCTIONS:  Please follow up with the pharmacist regarding your glucose sensor refills and ensure you start taking atorvastatin  as prescribed. We will recheck your blood pressure before you leave the clinic. Start Ozempic  as directed and monitor your blood sugar levels closely. Make dietary changes to reduce your sugar intake and consider switching to diet drinks.

## 2024-01-11 NOTE — Progress Notes (Signed)
 Subjective:  Patient ID: Logan Patton, male    DOB: 1986/06/24  Age: 38 y.o. MRN: [id number]  CC: std testing  and Diabetes     Discussed the use of AI scribe software for clinical note transcription with the patient, who gave verbal consent to proceed.  History of Present Illness Logan Patton is a 38 year old male with a history of type 2 diabetes mellitus, morbid obesity, GERD who presents with elevated blood sugar levels and requests STD testing.  Blood sugar levels are consistently in the high 200s to 300s. Current medications include glipizide  5 mg twice daily and metformin  500 mg daily. Last A1c was 13.1, up from 7.7 in March. He attributes this increase to dietary changes and lack of glucose sensors. He has not started Ozempic  due to concerns about side effects. He experiences gastrointestinal discomfort with metformin  but continues to take it. He has not been exercising and admits to poor dietary habits, including sweet drinks. He has not been taking atorvastatin  as he was unaware it was prescribed. He missed a follow-up appointment with the pharmacist regarding his medications.  He requests STD testing, specifically a urine test, but declines HIV and syphilis testing. He mentions recent irritation without specifying further symptoms.    Past Medical History:  Diagnosis Date   GERD (gastroesophageal reflux disease)    Type 2 diabetes mellitus (HCC) 03/31/2023    No past surgical history on file.  Family History  Problem Relation Age of Onset   Hypertension Mother     Social History   Socioeconomic History   Marital status: Single    Spouse name: Not on file   Number of children: Not on file   Years of education: Not on file   Highest education level: Not on file  Occupational History   Occupation: unemployed  Tobacco Use   Smoking status: Never   Smokeless tobacco: Never  Vaping Use   Vaping status: Never Used  Substance and Sexual Activity   Alcohol use:  Yes    Comment: occasionally   Drug use: Not Currently    Types: Marijuana   Sexual activity: Yes  Other Topics Concern   Not on file  Social History Narrative   Not on file   Social Drivers of Health   Financial Resource Strain: Not on file  Food Insecurity: Not on file  Transportation Needs: Not on file  Physical Activity: Not on file  Stress: Not on file  Social Connections: Not on file    No Known Allergies  Outpatient Medications Prior to Visit  Medication Sig Dispense Refill   acetaminophen  (TYLENOL ) 500 MG tablet Take 2 tablets (1,000 mg total) by mouth every 8 (eight) hours as needed for mild pain (pain score 1-3) or moderate pain (pain score 4-6). 60 tablet 0   Blood Glucose Monitoring Suppl (TRUE METRIX METER) w/Device KIT Testing 3 (three) times daily before meals. 1 kit 0   celecoxib  (CELEBREX ) 200 MG capsule Take 1 capsule (200 mg total) by mouth 2 (two) times daily. 20 capsule 0   clotrimazole  (LOTRIMIN ) 1 % cream Apply to affected area 3 times daily 15 g 0   cyclobenzaprine  (FLEXERIL ) 10 MG tablet Take 1 tablet (10 mg total) by mouth 2 (two) times daily as needed for muscle spasms. 20 tablet 0   diclofenac  (VOLTAREN ) 75 MG EC tablet Take 1 tablet (75 mg total) by mouth 2 (two) times daily. 14 tablet 0   diclofenac  Sodium (VOLTAREN ) 1 %  GEL Apply 2 g topically 4 (four) times daily. 50 g 0   doxycycline  (VIBRAMYCIN ) 100 MG capsule Take 1 capsule (100 mg total) by mouth 2 (two) times daily. 20 capsule 0   glucose blood (TRUE METRIX BLOOD GLUCOSE TEST) test strip Use 3 times daily 100 each 12   loratadine (CLARITIN) 10 MG tablet Take 10 mg by mouth daily as needed for allergies.     metFORMIN  (GLUCOPHAGE -XR) 500 MG 24 hr tablet Take 1 tablet (500 mg total) by mouth daily. 90 tablet 1   oxyCODONE  (ROXICODONE ) 5 MG immediate release tablet Take 1 tablet (5 mg total) by mouth every 6 (six) hours as needed for severe pain (pain score 7-10). 15 tablet 0   Semaglutide , 1  MG/DOSE, 4 MG/3ML SOPN Inject 1 mg as directed once a week. 3 mL 3   Semaglutide ,0.25 or 0.5MG /DOS, (OZEMPIC , 0.25 OR 0.5 MG/DOSE,) 2 MG/3ML SOPN Inject 0.5 mg into the skin once a week. 3 mL 0   TRUEplus Lancets 28G MISC Testing 3 (three) times daily before meals. 100 each 12   atorvastatin  (LIPITOR) 20 MG tablet Take 1 tablet (20 mg total) by mouth daily. 90 tablet 1   glipiZIDE  (GLUCOTROL ) 5 MG tablet Take 1 tablet (5 mg total) by mouth 2 (two) times daily before a meal. 60 tablet 6   No facility-administered medications prior to visit.     ROS Review of Systems  Constitutional:  Negative for activity change and appetite change.  HENT:  Negative for sinus pressure and sore throat.   Respiratory:  Negative for chest tightness, shortness of breath and wheezing.   Cardiovascular:  Negative for chest pain and palpitations.  Gastrointestinal:  Negative for abdominal distention, abdominal pain and constipation.  Genitourinary: Negative.   Musculoskeletal: Negative.   Psychiatric/Behavioral:  Negative for behavioral problems and dysphoric mood.     Objective:  BP (!) 144/91 (BP Location: Left Arm, Patient Position: Sitting, Cuff Size: Large)   Pulse 76   Wt (!) 562 lb 3.2 oz (255 kg)   SpO2 97%   BMI 68.43 kg/m      01/11/2024    1:39 PM 11/23/2023   11:35 AM 11/15/2023    3:16 AM  BP/Weight  Systolic BP 144 157 126  Diastolic BP 91 88 84  Wt. (Lbs) 562.2    BMI 68.43 kg/m2        Physical Exam Constitutional:      Appearance: He is well-developed. He is obese.  Cardiovascular:     Rate and Rhythm: Normal rate.     Heart sounds: Normal heart sounds. No murmur heard. Pulmonary:     Effort: Pulmonary effort is normal.     Breath sounds: Normal breath sounds. No wheezing or rales.  Chest:     Chest wall: No tenderness.  Abdominal:     General: Bowel sounds are normal. There is no distension.     Palpations: Abdomen is soft. There is no mass.     Tenderness: There is no  abdominal tenderness.  Musculoskeletal:        General: Normal range of motion.     Right lower leg: No edema.     Left lower leg: No edema.  Neurological:     Mental Status: He is alert and oriented to person, place, and time.  Psychiatric:        Mood and Affect: Mood normal.        Latest Ref Rng & Units 07/30/2023  10:46 PM 03/31/2023    9:55 AM 11/25/2022    2:30 AM  CMP  Glucose 70 - 99 mg/dL 796  563  445   BUN 6 - 20 mg/dL 14  8  12    Creatinine 0.61 - 1.24 mg/dL 9.19  9.16  9.06   Sodium 135 - 145 mmol/L 138  135  127   Potassium 3.5 - 5.1 mmol/L 3.8  4.4  3.9   Chloride 98 - 111 mmol/L 101  97  92   CO2 22 - 32 mmol/L 28  22  23    Calcium  8.9 - 10.3 mg/dL 9.1  9.5  8.9   Total Protein 6.0 - 8.5 g/dL  7.3    Total Bilirubin 0.0 - 1.2 mg/dL  0.5    Alkaline Phos 44 - 121 IU/L  83    AST 0 - 40 IU/L  14    ALT 0 - 44 IU/L  17      Lipid Panel     Component Value Date/Time   CHOL 238 (H) 03/31/2023 0955   TRIG 240 (H) 03/31/2023 0955   HDL 23 (L) 03/31/2023 0955   LDLCALC 169 (H) 03/31/2023 0955    CBC    Component Value Date/Time   WBC 10.6 (H) 07/30/2023 2246   RBC 4.52 07/30/2023 2246   HGB 13.4 07/30/2023 2246   HCT 41.3 07/30/2023 2246   PLT 297 07/30/2023 2246   MCV 91.4 07/30/2023 2246   MCH 29.6 07/30/2023 2246   MCHC 32.4 07/30/2023 2246   RDW 15.3 07/30/2023 2246   LYMPHSABS 3.9 11/25/2022 0230   MONOABS 0.8 11/25/2022 0230   EOSABS 0.3 11/25/2022 0230   BASOSABS 0.1 11/25/2022 0230    Lab Results  Component Value Date   HGBA1C 13.1 (A) 01/11/2024    Lab Results  Component Value Date   HGBA1C 13.1 (A) 01/11/2024   HGBA1C 7.7 (A) 09/02/2023   HGBA1C 12.5 (A) 05/04/2023       Assessment & Plan Type 2 Diabetes Mellitus Blood glucose poorly controlled, HbA1c increased to 13.1%. Delayed Ozempic  start due to nausea concerns.  Cost complications if hyperglycemia persists. Sweet drinks contributing to hyperglycemia. Ozempic  expected to  aid weight loss and glucose control. - Start Ozempic  0.25 mg weekly for 4 weeks, increase to 0.5 mg, then 1 mg as tolerated. - Increase glipizide  to 10 mg twice daily. - Continue metformin  500 mg daily. - Refill atorvastatin . - Encouraged to reduce sweet drinks. - Check sensor refills with pharmacist Herlene.  I have spoken with the clinical pharmacist who states this patient was enrolled for the liberate study but he no-showed 3 times and his 6 months is up for the study.  Unfortunately due to lack of medical coverage he will be unable to pay for sensors. Will need to obtain manual glucometer from the pharmacy.  Elevated blood pressure reading Blood pressure slightly elevated. - Recheck blood pressure before leaving clinic. - Consider initiating antihypertensive if still elevated at next visit   Hyperlipidemia associated with type 2 diabetes mellitus Uncontrolled Nonadherent with statin Advised to resume statin   Sexually Transmitted Disease Screening Requested STD testing, declined HIV and syphilis tests. - Order urine test for STD screening.    Meds ordered this encounter  Medications   glipiZIDE  (GLUCOTROL ) 10 MG tablet    Sig: Take 1 tablet (10 mg total) by mouth 2 (two) times daily before a meal.    Dispense:  180 tablet    Refill:  1   atorvastatin  (LIPITOR) 20 MG tablet    Sig: Take 1 tablet (20 mg total) by mouth daily.    Dispense:  90 tablet    Refill:  1    Follow-up: Return in about 1 month (around 02/11/2024) for Diabetes follow-up with PCP.       Corrina Sabin, MD, FAAFP. [facility] [place], [place] [phone]   01/11/2024, 3:10 PM

## 2024-01-12 ENCOUNTER — Ambulatory Visit: Payer: Self-pay | Admitting: Family Medicine

## 2024-01-12 ENCOUNTER — Other Ambulatory Visit: Payer: Self-pay

## 2024-01-12 LAB — URINE CYTOLOGY ANCILLARY ONLY
Chlamydia: NEGATIVE
Comment: NEGATIVE
Comment: NEGATIVE
Comment: NORMAL
Neisseria Gonorrhea: NEGATIVE
Trichomonas: POSITIVE — AB

## 2024-01-12 MED ORDER — METRONIDAZOLE 500 MG PO TABS: 2000.0000 mg | ORAL_TABLET | Freq: Once | ORAL | 0 refills | Status: AC

## 2024-02-21 ENCOUNTER — Other Ambulatory Visit: Payer: Self-pay

## 2024-03-02 ENCOUNTER — Other Ambulatory Visit: Payer: Self-pay

## 2024-04-17 ENCOUNTER — Other Ambulatory Visit: Payer: Self-pay

## 2024-04-24 ENCOUNTER — Other Ambulatory Visit: Payer: Self-pay

## 2024-04-27 ENCOUNTER — Other Ambulatory Visit: Payer: Self-pay

## 2024-05-01 ENCOUNTER — Other Ambulatory Visit: Payer: Self-pay

## 2024-06-12 ENCOUNTER — Other Ambulatory Visit: Payer: Self-pay

## 2024-07-19 ENCOUNTER — Other Ambulatory Visit: Payer: Self-pay

## 2024-08-02 ENCOUNTER — Other Ambulatory Visit: Payer: Self-pay | Admitting: Family Medicine

## 2024-08-08 ENCOUNTER — Ambulatory Visit: Payer: Self-pay | Admitting: Family Medicine
